# Patient Record
Sex: Male | Born: 1980 | Race: White | Hispanic: No | Marital: Single | Smoking: Never smoker
Health system: Southern US, Community
[De-identification: ages and names within clinical notes are randomized; demographics above are authoritative.]

## PROBLEM LIST (undated history)

## (undated) DIAGNOSIS — R091 Pleurisy: Secondary | ICD-10-CM

## (undated) DIAGNOSIS — F419 Anxiety disorder, unspecified: Secondary | ICD-10-CM

## (undated) DIAGNOSIS — T7840XA Allergy, unspecified, initial encounter: Secondary | ICD-10-CM

## (undated) DIAGNOSIS — E669 Obesity, unspecified: Secondary | ICD-10-CM

## (undated) DIAGNOSIS — H409 Unspecified glaucoma: Secondary | ICD-10-CM

## (undated) DIAGNOSIS — F32A Depression, unspecified: Secondary | ICD-10-CM

## (undated) DIAGNOSIS — F329 Major depressive disorder, single episode, unspecified: Secondary | ICD-10-CM

## (undated) HISTORY — DX: Major depressive disorder, single episode, unspecified: F32.9

## (undated) HISTORY — DX: Allergy, unspecified, initial encounter: T78.40XA

## (undated) HISTORY — DX: Obesity, unspecified: E66.9

## (undated) HISTORY — PX: TONSILLECTOMY AND ADENOIDECTOMY: SUR1326

## (undated) HISTORY — PX: MYRINGOTOMY WITH TUBE PLACEMENT: SHX5663

## (undated) HISTORY — DX: Unspecified glaucoma: H40.9

## (undated) HISTORY — DX: Depression, unspecified: F32.A

## (undated) HISTORY — DX: Anxiety disorder, unspecified: F41.9

## (undated) HISTORY — DX: Pleurisy: R09.1

---

## 2009-03-20 DIAGNOSIS — R091 Pleurisy: Secondary | ICD-10-CM

## 2009-03-20 HISTORY — DX: Pleurisy: R09.1

## 2009-10-26 ENCOUNTER — Emergency Department (HOSPITAL_COMMUNITY): Admission: EM | Admit: 2009-10-26 | Discharge: 2009-10-26 | Payer: Self-pay | Admitting: Emergency Medicine

## 2010-06-03 LAB — POCT I-STAT, CHEM 8
BUN: 14 mg/dL (ref 6–23)
Glucose, Bld: 91 mg/dL (ref 70–99)
Hemoglobin: 16.3 g/dL (ref 13.0–17.0)
Potassium: 4.4 mEq/L (ref 3.5–5.1)

## 2010-11-11 ENCOUNTER — Emergency Department (HOSPITAL_COMMUNITY)
Admission: EM | Admit: 2010-11-11 | Discharge: 2010-11-11 | Disposition: A | Discharge status: Home / Self Care | Payer: BC Managed Care – PPO | Attending: Emergency Medicine | Admitting: Emergency Medicine

## 2010-11-11 DIAGNOSIS — H40219 Acute angle-closure glaucoma, unspecified eye: Secondary | ICD-10-CM | POA: Insufficient documentation

## 2010-11-11 DIAGNOSIS — H5789 Other specified disorders of eye and adnexa: Secondary | ICD-10-CM | POA: Insufficient documentation

## 2010-11-11 DIAGNOSIS — R51 Headache: Secondary | ICD-10-CM | POA: Insufficient documentation

## 2010-11-14 LAB — DIFFERENTIAL
Basophils Relative: 0 % (ref 0–1)
Eosinophils Absolute: 0 10*3/uL (ref 0.0–0.7)
Eosinophils Relative: 0 % (ref 0–5)
Lymphocytes Relative: 10 % — ABNORMAL LOW (ref 12–46)
Lymphs Abs: 1.2 10*3/uL (ref 0.7–4.0)
Neutro Abs: 9.8 10*3/uL — ABNORMAL HIGH (ref 1.7–7.7)

## 2010-11-14 LAB — CBC
HCT: 40.6 % (ref 39.0–52.0)
Hemoglobin: 13.9 g/dL (ref 13.0–17.0)
MCH: 29.8 pg (ref 26.0–34.0)
MCHC: 34.2 g/dL (ref 30.0–36.0)
MCV: 87.1 fL (ref 78.0–100.0)
Platelets: 225 10*3/uL (ref 150–400)
RBC: 4.66 MIL/uL (ref 4.22–5.81)
RDW: 13.2 % (ref 11.5–15.5)
WBC: 11.5 10*3/uL — ABNORMAL HIGH (ref 4.0–10.5)

## 2010-11-14 LAB — APTT: aPTT: 35 seconds (ref 24–37)

## 2011-08-24 IMAGING — CR DG CHEST 2V
2 series · 2 of 2 positions shown · non-contrast
Comparison: None.

CLINICAL DATA: Pleuritic chest pain radiating to left arm.

CHEST - 2 VIEW

[w chest pa]
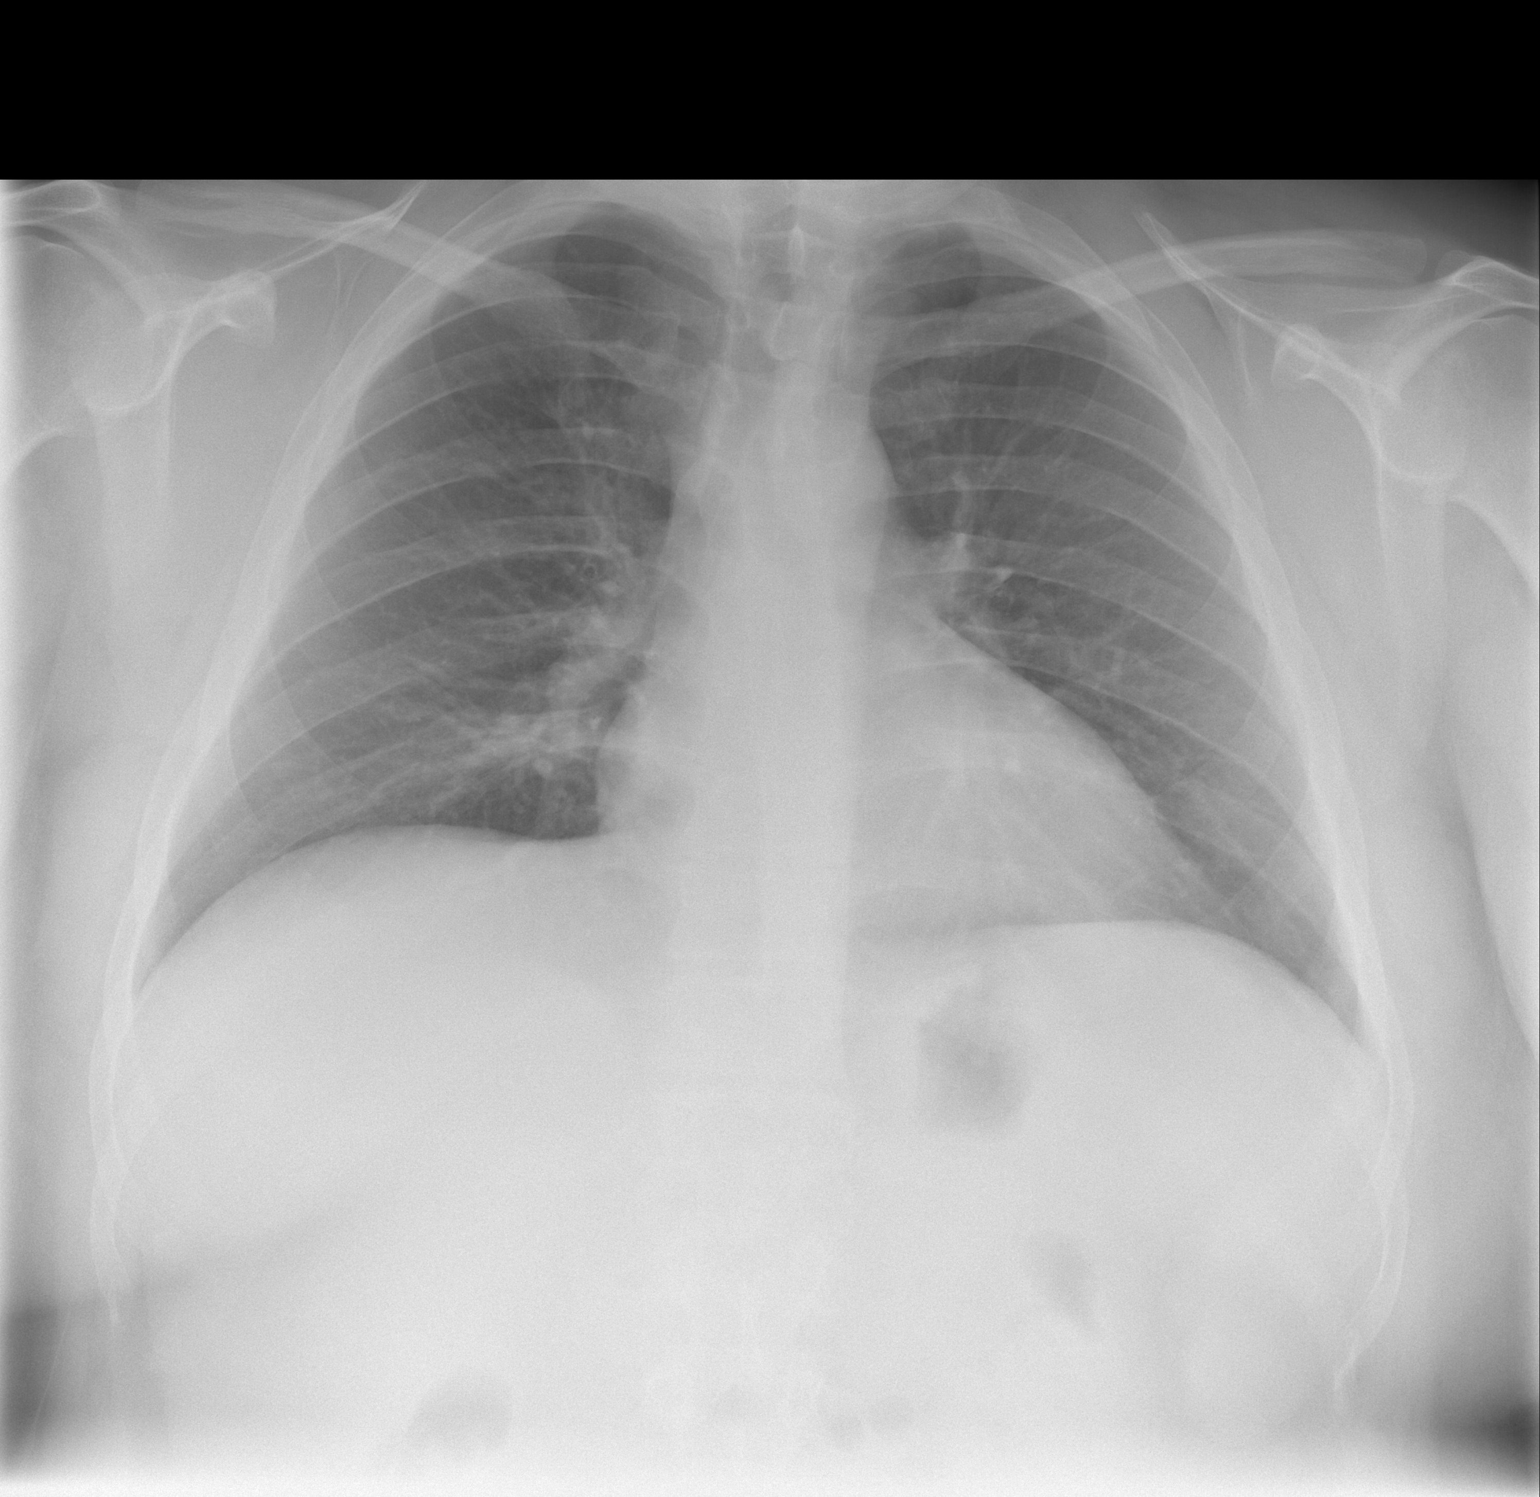

[w chest lat]
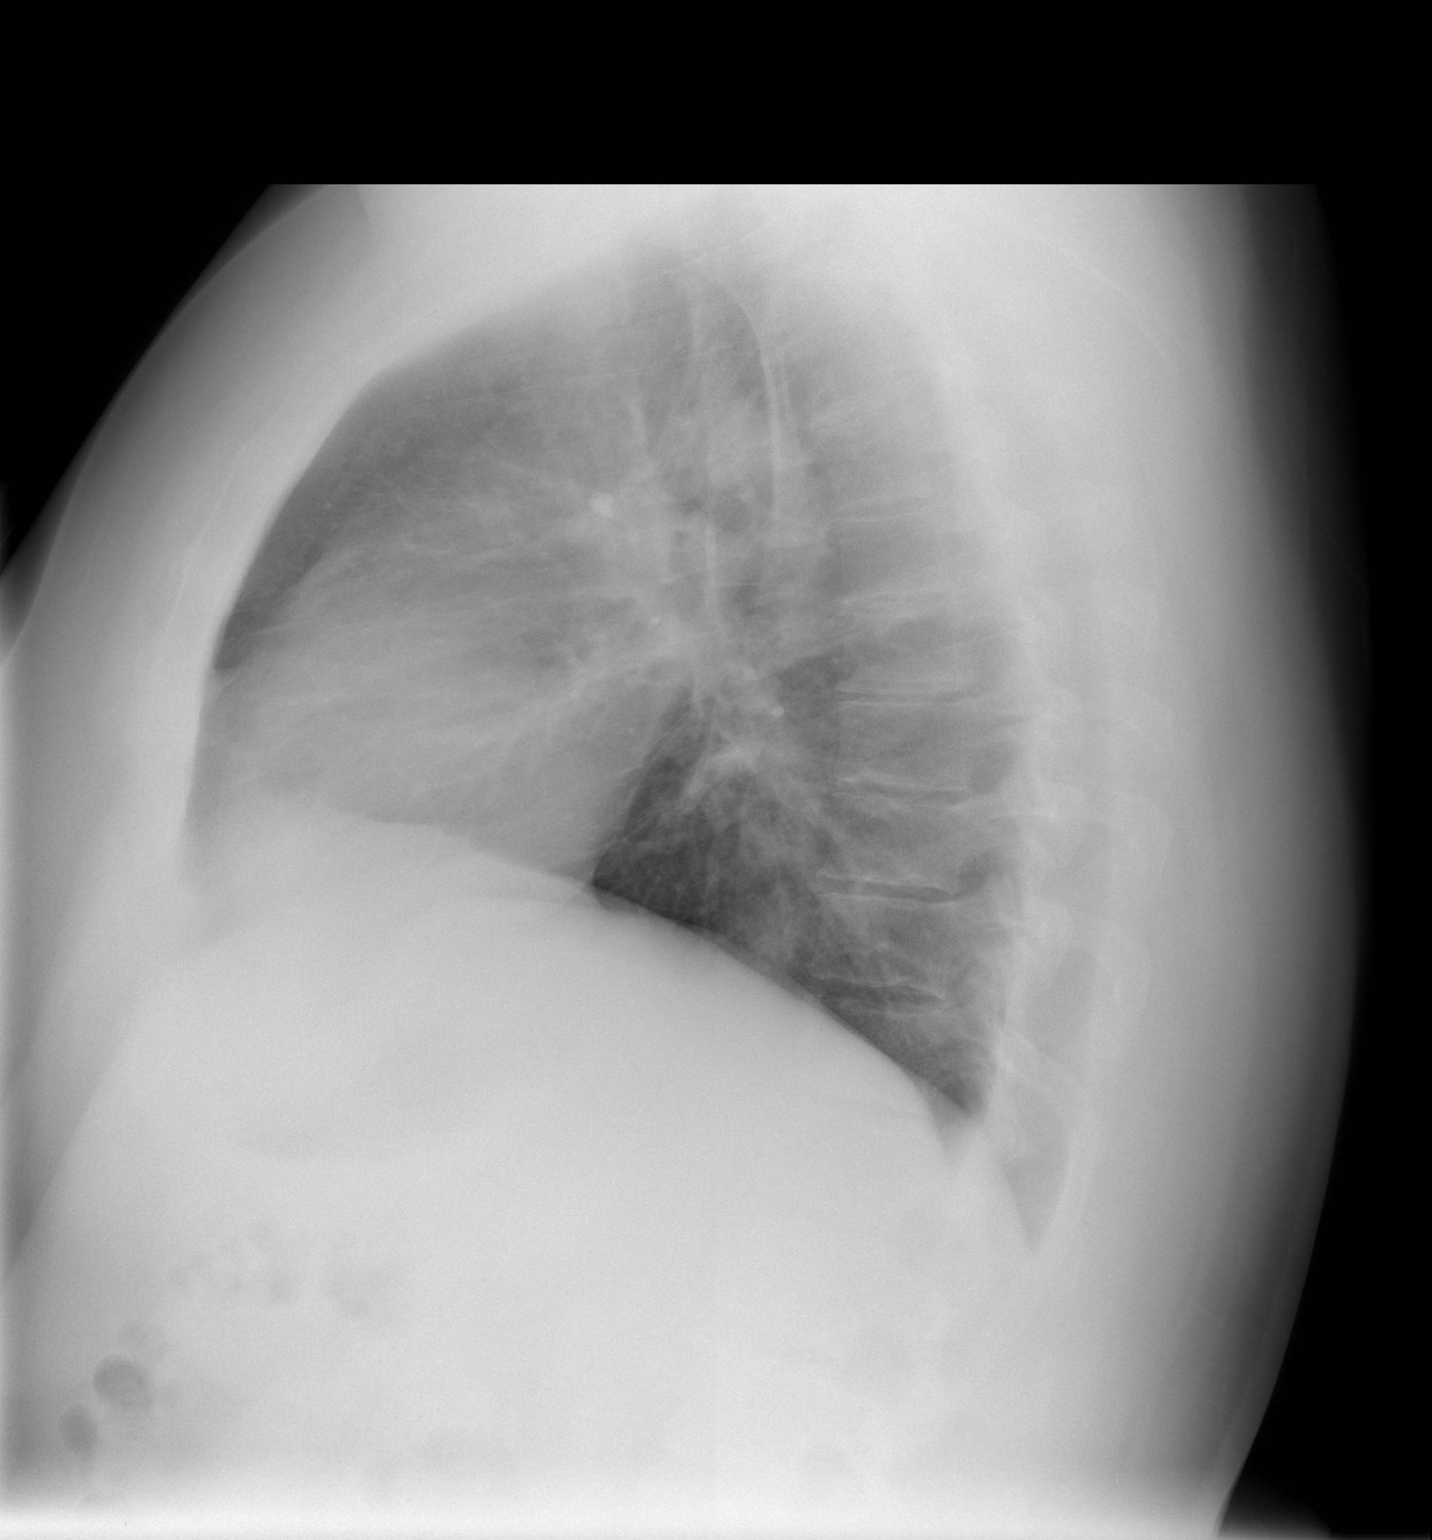

[2 of 2 positions shown; findings below may reference images not displayed]

FINDINGS: Low lung volumes are seen however both lungs are clear.
No evidence of pleural effusion or pneumothorax.  Heart size and
mediastinal contours are normal.
IMPRESSION: No active disease.

## 2012-02-09 ENCOUNTER — Ambulatory Visit (INDEPENDENT_AMBULATORY_CARE_PROVIDER_SITE_OTHER): Payer: BC Managed Care – PPO | Admitting: Physician Assistant

## 2012-02-09 VITALS — BP 111/75 | HR 61 | Temp 98.6°F | Resp 16 | Ht 70.0 in | Wt 208.0 lb

## 2012-02-09 DIAGNOSIS — R197 Diarrhea, unspecified: Secondary | ICD-10-CM

## 2012-02-09 DIAGNOSIS — R112 Nausea with vomiting, unspecified: Secondary | ICD-10-CM

## 2012-02-09 DIAGNOSIS — R350 Frequency of micturition: Secondary | ICD-10-CM

## 2012-02-09 LAB — POCT URINALYSIS DIPSTICK
Bilirubin, UA: NEGATIVE
Blood, UA: NEGATIVE
Glucose, UA: NEGATIVE
Ketones, UA: NEGATIVE
Leukocytes, UA: NEGATIVE
Nitrite, UA: NEGATIVE
Protein, UA: NEGATIVE
Spec Grav, UA: 1.025
Urobilinogen, UA: 0.2
pH, UA: 6.5

## 2012-02-09 LAB — POCT UA - MICROSCOPIC ONLY
Bacteria, U Microscopic: NEGATIVE
Casts, Ur, LPF, POC: NEGATIVE
Crystals, Ur, HPF, POC: NEGATIVE
Epithelial cells, urine per micros: NEGATIVE
RBC, urine, microscopic: NEGATIVE
Yeast, UA: NEGATIVE

## 2012-02-09 MED ORDER — ONDANSETRON 8 MG PO TBDP: 8.0000 mg | ORAL_TABLET | Freq: Three times a day (TID) | ORAL | Status: DC | PRN

## 2012-02-09 NOTE — Patient Instructions (Signed)
Viral Gastroenteritis Viral gastroenteritis is also known as stomach flu. This condition affects the stomach and intestinal tract. It can cause sudden diarrhea and vomiting. The illness typically lasts 3 to 8 days. Most people develop an immune response that eventually gets rid of the virus. While this natural response develops, the virus can make you quite ill. CAUSES  Many different viruses can cause gastroenteritis, such as rotavirus or noroviruses. You can catch one of these viruses by consuming contaminated food or water. You may also catch a virus by sharing utensils or other personal items with an infected person or by touching a contaminated surface. SYMPTOMS  The most common symptoms are diarrhea and vomiting. These problems can cause a severe loss of body fluids (dehydration) and a body salt (electrolyte) imbalance. Other symptoms may include:  Fever.  Headache.  Fatigue.  Abdominal pain. DIAGNOSIS  Your caregiver can usually diagnose viral gastroenteritis based on your symptoms and a physical exam. A stool sample may also be taken to test for the presence of viruses or other infections. TREATMENT  This illness typically goes away on its own. Treatments are aimed at rehydration. The most serious cases of viral gastroenteritis involve vomiting so severely that you are not able to keep fluids down. In these cases, fluids must be given through an intravenous line (IV). HOME CARE INSTRUCTIONS   Drink enough fluids to keep your urine clear or pale yellow. Drink small amounts of fluids frequently and increase the amounts as tolerated.  Ask your caregiver for specific rehydration instructions.  Avoid:  Foods high in sugar.  Alcohol.  Carbonated drinks.  Tobacco.  Juice.  Caffeine drinks.  Extremely hot or cold fluids.  Fatty, greasy foods.  Too much intake of anything at one time.  Dairy products until 24 to 48 hours after diarrhea stops.  You may consume probiotics.  Probiotics are active cultures of beneficial bacteria. They may lessen the amount and number of diarrheal stools in adults. Probiotics can be found in yogurt with active cultures and in supplements.  Wash your hands well to avoid spreading the virus.  Only take over-the-counter or prescription medicines for pain, discomfort, or fever as directed by your caregiver. Do not give aspirin to children. Antidiarrheal medicines are not recommended.  Ask your caregiver if you should continue to take your regular prescribed and over-the-counter medicines.  Keep all follow-up appointments as directed by your caregiver. SEEK IMMEDIATE MEDICAL CARE IF:   You are unable to keep fluids down.  You do not urinate at least once every 6 to 8 hours.  You develop shortness of breath.  You notice blood in your stool or vomit. This may look like coffee grounds.  You have abdominal pain that increases or is concentrated in one small area (localized).  You have persistent vomiting or diarrhea.  You have a fever.  The patient is a child younger than 3 months, and he or she has a fever.  The patient is a child older than 3 months, and he or she has a fever and persistent symptoms.  The patient is a child older than 3 months, and he or she has a fever and symptoms suddenly get worse.  The patient is a baby, and he or she has no tears when crying. MAKE SURE YOU:   Understand these instructions.  Will watch your condition.  Will get help right away if you are not doing well or get worse. Document Released: 03/06/2005 Document Revised: 05/29/2011 Document Reviewed: 12/21/2010   [employer] Patient Information 2013 [employer].  

## 2012-02-09 NOTE — Progress Notes (Signed)
  Subjective:    Patient ID: Karl Bell, male    DOB: [birth year], 31 y.o.   MRN: [id number]  HPI 31 year old male presents 2 day history of nausea with emesis, diarrhea, and epigastric discomfort.  He has had 4-5 episodes of watery diarrhea and 2-3 or episodes of emesis. Emesis seems to be improving (last episode around 2:00 p.m today).  No recent travel or known ill contacts.  Denies fever, chills, hematemesis, hematuria, or hematochezia.  No history of gastric ulcer or h. Pylori infection. No previous abdominal surgeries.  Admits to some urinary frequency but no dysuria or back pain.  He is an otherwise healthy male with no other complaints today. He has been able to keep liquids down, but not solid foods.  He has been eating mostly soup over the past 2 days.     Review of Systems  Constitutional: Negative for fever and chills.  Gastrointestinal: Positive for nausea, vomiting, abdominal pain (epigastric) and diarrhea.  Genitourinary: Positive for frequency. Negative for dysuria.  Neurological: Negative for dizziness and headaches.  All other systems reviewed and are negative.       Objective:   Physical Exam  Constitutional: He is oriented to person, place, and time. He appears well-developed and well-nourished.  HENT:  Head: Normocephalic and atraumatic.  Right Ear: External ear normal.  Left Ear: External ear normal.  Eyes: Conjunctivae normal are normal.  Neck: Normal range of motion.  Cardiovascular: Normal rate, regular rhythm and normal heart sounds.   Pulmonary/Chest: Effort normal and breath sounds normal.  Abdominal: Soft. Bowel sounds are normal. There is no hepatosplenomegaly. There is tenderness (minimal epigastric). There is no rigidity, no rebound, no guarding, no CVA tenderness, no tenderness at McBurney's point and negative Murphy's sign.  Neurological: He is alert and oriented to person, place, and time.  Psychiatric: He has a normal mood and affect. His behavior is  normal. Judgment and thought content normal.      Results for orders placed in visit on 02/09/12  POCT URINALYSIS DIPSTICK      Component Value Range   Color, UA yellow     Clarity, UA clear     Glucose, UA neg     Bilirubin, UA neg     Ketones, UA neg     Spec Grav, UA 1.025     Blood, UA neg     pH, UA 6.5     Protein, UA neg     Urobilinogen, UA 0.2     Nitrite, UA neg     Leukocytes, UA Negative    POCT UA - MICROSCOPIC ONLY      Component Value Range   WBC, Ur, HPF, POC 0-3     RBC, urine, microscopic neg     Bacteria, U Microscopic neg     Mucus, UA trace     Epithelial cells, urine per micros neg     Crystals, Ur, HPF, POC neg     Casts, Ur, LPF, POC neg     Yeast, UA neg         Assessment & Plan:   1. Nausea with vomiting  ondansetron (ZOFRAN-ODT) 8 MG disintegrating tablet  2. Urinary frequency  POCT urinalysis dipstick, POCT UA - Microscopic Only  3. Diarrhea     Reassurance provided - likely viral gastroenteritis Zofran tid prn nausea BRAT diet as tolerated Immodium as needed for diarrhea RTC or go to ER with worsening abdominal pain, fever, or chills.

## 2012-02-13 ENCOUNTER — Ambulatory Visit (INDEPENDENT_AMBULATORY_CARE_PROVIDER_SITE_OTHER): Payer: BC Managed Care – PPO | Admitting: Physician Assistant

## 2012-02-13 VITALS — BP 144/82 | HR 60 | Temp 98.4°F | Resp 16 | Ht 69.5 in | Wt 207.2 lb

## 2012-02-13 DIAGNOSIS — B309 Viral conjunctivitis, unspecified: Secondary | ICD-10-CM

## 2012-02-13 NOTE — Progress Notes (Signed)
   [address] [place] [address]   Phone [phone]  Subjective:    Patient ID: Karl Bell, male    DOB: Aug 30, 1980, 31 y.o.   MRN: [id number]  HPI Pt presents to clinic with mild L eye redness that he had when he woke up this am.  Karl Bell to bed normal last pm and this am both eyes were crusty. His L eye is slightly red but only a little bit of clear d/c from eye today.  No vision change.  Only wears glasses, never contacts.  Eye feels swollen.  Review of Systems  Constitutional: Negative for fever and chills.  HENT: Negative for congestion, rhinorrhea and postnasal drip.   Eyes: Positive for discharge and redness. Negative for photophobia, pain and visual disturbance.  Respiratory: Negative for cough.        Objective:   Physical Exam  Nursing note and vitals reviewed. Constitutional: He is oriented to person, place, and time. He appears well-developed and well-nourished.  HENT:  Head: Normocephalic and atraumatic.  Right Ear: External ear normal.  Left Ear: External ear normal.  Eyes: EOM are normal. Pupils are equal, round, and reactive to light. Right eye exhibits no discharge. Left eye exhibits no discharge. Right conjunctiva is injected (mild medial injection, limbus clear). No scleral icterus.  Neck: Neck supple. No thyromegaly present.  Cardiovascular: Normal rate, regular rhythm and normal heart sounds.   No murmur heard. Neurological: He is alert and oriented to person, place, and time.  Skin: Skin is warm and dry.  Psychiatric: He has a normal mood and affect. His behavior is normal. Judgment and thought content normal.       Assessment & Plan:   1. Viral conjunctivitis    Pt to use artifical tears.  Monitor for change. If eyes get totally red and he starts to have yellow drainage all day call for abx gtts.

## 2012-02-26 ENCOUNTER — Ambulatory Visit (INDEPENDENT_AMBULATORY_CARE_PROVIDER_SITE_OTHER): Payer: 59 | Admitting: Family Medicine

## 2012-02-26 VITALS — BP 137/85 | HR 80 | Temp 98.4°F | Resp 18 | Ht 70.0 in | Wt 211.0 lb

## 2012-02-26 DIAGNOSIS — H109 Unspecified conjunctivitis: Secondary | ICD-10-CM

## 2012-02-26 DIAGNOSIS — H53149 Visual discomfort, unspecified: Secondary | ICD-10-CM

## 2012-02-26 NOTE — Progress Notes (Signed)
  Subjective:    Patient ID: Karl Bell, male    DOB: 1980-11-21, 31 y.o.   MRN: [id number]  HPI 31 year old male presents with acute onset of left eye erythema yesterday.  States it started sometime in the afternoon around 4:30 p.m.  Later in the evening it was accompanied by extreme photosensitivity and frontal/occipital headache. He took ibuprofen which helped slightly.  Admits to watery drainage when looking at lights. Denies any purulent drainage or crusting.  Right eye is not affected.  Denies nausea, vomiting, fever, chills, URI symptoms, or abdominal pain.  States the light sensitivity has improved slightly today and headache has resolved, however his vision has become more blurred.  Was treated for viral URI of the same eye on 11/26 which subsequently improved. He is otherwise healthy with no other complaints today. 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  Also had allergic reaction to sulfa eye drops that caused retinal scarring in his left eye.      Review of Systems  Constitutional: Negative for fever and chills.  HENT: Negative for ear pain, congestion, sore throat, rhinorrhea, neck pain and postnasal drip.   Eyes: Positive for photophobia, pain, redness and visual disturbance (blurred vision).  Gastrointestinal: Negative for nausea and vomiting.  Neurological: Negative for dizziness and headaches.  All other systems reviewed and are negative.       Objective:   Physical Exam  Constitutional: He is oriented to person, place, and time. He appears well-developed and well-nourished.  HENT:  Head: Normocephalic and atraumatic.  Right Ear: Hearing, tympanic membrane, external ear and ear canal normal.  Left Ear: Hearing, tympanic membrane, external ear and ear canal normal.  Mouth/Throat: Uvula is midline, oropharynx is clear and moist and mucous membranes are normal. No oropharyngeal exudate.  Eyes: EOM and lids are normal. Left  conjunctiva is injected. Pupils are unequal (left pupil smaller than right).  Neck: Normal range of motion.  Cardiovascular: Normal rate, regular rhythm and normal heart sounds.   Pulmonary/Chest: Effort normal and breath sounds normal.  Lymphadenopathy:    He has no cervical adenopathy.  Neurological: He is alert and oriented to person, place, and time.  Psychiatric: He has a normal mood and affect. His behavior is normal. Judgment and thought content normal.          Assessment & Plan:   1. Photophobia   2. Conjunctivitis   Etiology unclear - sent to opthalmology for further evaluation and treatment.

## 2012-02-26 NOTE — Progress Notes (Signed)
  Subjective:    Patient ID: Karl Bell, male    DOB: 16-Aug-1980, 31 y.o.   MRN: [id number]  Eye Pain  Associated symptoms include eye redness and photophobia. Pertinent negatives include no fever, nausea or vomiting.   31 year old male presents with acute onset of left eye erythema yesterday.  States it started sometime in the afternoon around 4:30 p.m.  Later in the evening it was accompanied by extreme photosensitivity and frontal/occipital headache. He took ibuprofen which helped slightly.  Admits to watery drainage when looking at lights. Denies any purulent drainage or crusting.  Right eye is not affected.  Denies nausea, vomiting, fever, chills, URI symptoms, or abdominal pain.  States the light sensitivity has improved slightly today and headache has resolved, however his vision has become more blurred.  Was treated for viral URI of the same eye on 11/26 which subsequently improved. He is otherwise healthy with no other complaints today. 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  Also had allergic reaction to sulfa eye drops that caused retinal scarring in his left eye.      Review of Systems  Constitutional: Negative for fever and chills.  HENT: Negative for ear pain, congestion, sore throat, rhinorrhea, neck pain and postnasal drip.   Eyes: Positive for photophobia, pain, redness and visual disturbance (blurred vision).  Gastrointestinal: Negative for nausea and vomiting.  Neurological: Negative for dizziness and headaches.  All other systems reviewed and are negative.       Objective:   Physical Exam  Constitutional: He is oriented to person, place, and time. He appears well-developed and well-nourished.  HENT:  Head: Normocephalic and atraumatic.  Right Ear: Hearing, tympanic membrane, external ear and ear canal normal.  Left Ear: Hearing, tympanic membrane, external ear and ear canal normal.  Mouth/Throat: Uvula is midline,  oropharynx is clear and moist and mucous membranes are normal. No oropharyngeal exudate.  Eyes: EOM and lids are normal. Left conjunctiva is injected. Pupils are unequal (left pupil smaller than right).  Neck: Normal range of motion.  Cardiovascular: Normal rate, regular rhythm and normal heart sounds.   Pulmonary/Chest: Effort normal and breath sounds normal.  Lymphadenopathy:    He has no cervical adenopathy.  Neurological: He is alert and oriented to person, place, and time.  Psychiatric: He has a normal mood and affect. His behavior is normal. Judgment and thought content normal.          Assessment & Plan:   1. Photophobia   2. Conjunctivitis   Etiology unclear - sent to opthalmology for further evaluation and treatment.

## 2012-06-28 ENCOUNTER — Ambulatory Visit (INDEPENDENT_AMBULATORY_CARE_PROVIDER_SITE_OTHER): Payer: 59 | Admitting: Physician Assistant

## 2012-06-28 DIAGNOSIS — F32A Depression, unspecified: Secondary | ICD-10-CM | POA: Insufficient documentation

## 2012-06-28 DIAGNOSIS — H409 Unspecified glaucoma: Secondary | ICD-10-CM | POA: Insufficient documentation

## 2012-06-28 DIAGNOSIS — H6690 Otitis media, unspecified, unspecified ear: Secondary | ICD-10-CM

## 2012-06-28 DIAGNOSIS — F329 Major depressive disorder, single episode, unspecified: Secondary | ICD-10-CM | POA: Insufficient documentation

## 2012-06-28 DIAGNOSIS — E669 Obesity, unspecified: Secondary | ICD-10-CM | POA: Insufficient documentation

## 2012-06-28 MED ORDER — IPRATROPIUM BROMIDE 0.03 % NA SOLN: 2.0000 | Freq: Two times a day (BID) | NASAL | Status: DC

## 2012-06-28 MED ORDER — GUAIFENESIN ER 1200 MG PO TB12: 1.0000 | ORAL_TABLET | Freq: Two times a day (BID) | ORAL | Status: DC | PRN

## 2012-06-28 MED ORDER — CLARITHROMYCIN ER 500 MG PO TB24: 1000.0000 mg | ORAL_TABLET | Freq: Every day | ORAL | Status: DC

## 2012-06-28 NOTE — Progress Notes (Signed)
Subjective:    Patient ID: Karl Bell, male    DOB: 06-02-1980, 32 y.o.   MRN: [id number]  HPI This 32 y.o. male presents for evaluation of LEFT ear pain. He's had nasal and sinus pressure and congestion and drainage for about a week. No fever, chills.  Has had some nausea. No diarrhea, myalgias, arthralgias. Thought he was recovering, but today symptoms worsened again and he's now having significant LEFT ear pain.     Past Medical History  Diagnosis Date  . Depression   . Glaucoma     2 acute episodes  . Pleurisy 2011  . Obesity     Past Surgical History  Procedure Laterality Date  . Myringotomy with tube placement Bilateral     x 5 sets of tubes  . Tonsillectomy and adenoidectomy      Prior to Admission medications   Medication Sig Start Date End Date Taking? Authorizing Provider  DULoxetine (CYMBALTA) 60 MG capsule Take 60 mg by mouth daily.   Yes Historical Provider, MD    Allergies  Allergen Reactions  . Penicillins Hives    severe  . Sulfa Antibiotics Hives    severe    History   Social History  . Marital Status: Single    Spouse Name: n/a    Number of Children: 0  . Years of Education: 15   Occupational History  . IT Tech    Social History Main Topics  . Smoking status: Never Smoker   . Smokeless tobacco: Never Used  . Alcohol Use: 1 - 1.5 oz/week    2-3 drink(s) per week  . Drug Use: No  . Sexually Active: Yes    Birth Control/ Protection: Condom     Comment: 4 partners in the past 12 months (06/2012)   Other Topics Concern  . Not on file   Social History Narrative   Lives alone.    Family History  Problem Relation Age of Onset  . Diabetes Mother   . Celiac disease Maternal Grandmother   . Heart disease Maternal Grandfather   . Mental illness Paternal Grandmother   . Heart disease Paternal Grandfather     s/p 4V CABG     Review of Systems As above.    Objective:   Physical Exam Blood pressure 124/78, pulse 70, temperature 98 F  (36.7 C), temperature source Oral, resp. rate 17, height 5\' 10"  (1.778 m), weight 221 lb (100.245 kg), SpO2 99.00%. Body mass index is 31.71 kg/(m^2). Well-developed, well nourished WM who is awake, alert and oriented, in NAD. HEENT: [place]/AT, PERRL, EOMI.  Sclera and conjunctiva are clear.  EAC are patent, RIGHT TM is dull but not retracted or injected.  LEFT TM is injected, mildly bulging and opaque in the attic. Nasal mucosa is pink and moist. OP is clear. Neck: supple, mildly tender in the tonsillar region L>R, no lymphadenopathy, thyromegaly. Heart: RRR, no murmur Lungs: normal effort, CTA Extremities: no cyanosis, clubbing or edema. Skin: warm and dry without rash. Psychologic: good mood and appropriate affect, normal speech and behavior.      Assessment & Plan:  AOM (acute otitis media), left - Plan: clarithromycin (BIAXIN XL) 500 MG 24 hr tablet, ipratropium (ATROVENT) 0.03 % nasal spray, Guaifenesin (MUCINEX MAXIMUM STRENGTH) 1200 MG TB12  Patient Instructions  Get plenty of rest and drink at least 64 ounces of water daily. Stop using any decongestant products for now. Using ibuprofen can also help reduce the inflammation and swelling.   Akirra Lacerda S.  Leotis Shames, PA-C Physician Assistant-Certified [facility]

## 2012-06-28 NOTE — Patient Instructions (Addendum)
Get plenty of rest and drink at least 64 ounces of water daily. Stop using any decongestant products for now. Using ibuprofen can also help reduce the inflammation and swelling.

## 2012-08-01 ENCOUNTER — Ambulatory Visit (INDEPENDENT_AMBULATORY_CARE_PROVIDER_SITE_OTHER): Payer: 59 | Admitting: Family Medicine

## 2012-08-01 VITALS — BP 140/78 | HR 60 | Temp 98.0°F | Resp 16 | Ht 70.75 in | Wt 221.2 lb

## 2012-08-01 DIAGNOSIS — J329 Chronic sinusitis, unspecified: Secondary | ICD-10-CM

## 2012-08-01 DIAGNOSIS — J309 Allergic rhinitis, unspecified: Secondary | ICD-10-CM

## 2012-08-01 MED ORDER — OLOPATADINE HCL 0.6 % NA SOLN: 2.0000 [drp] | Freq: Two times a day (BID) | NASAL | Status: DC

## 2012-08-01 MED ORDER — AZITHROMYCIN 250 MG PO TABS: ORAL_TABLET | ORAL | Status: DC

## 2012-08-01 NOTE — Progress Notes (Signed)
[facility]:  Office Visit  Chief Complaint:  Chief Complaint  Patient presents with  . Cough    today  . Sinusitis    4 days    HPI: Karl Bell is a 32 y.o. male who complains of  4 day history of runny nose 4-5 days, then woke up with cough this AM. He has a h/o allergies in the last few years. He has taken zyrtec x last 3 dayss. Yellow sputum,. Nonsmoker. No ear pain. + facial pressure. 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 Denies fevers, chills, chest pain, SOB.   Past Medical History  Diagnosis Date  . Depression   . Pleurisy 2011  . Obesity   . Allergy   . Anxiety   . Glaucoma     2 acute episodes   Past Surgical History  Procedure Laterality Date  . Myringotomy with tube placement Bilateral     x 5 sets of tubes  . Tonsillectomy and adenoidectomy     History   Social History  . Marital Status: Single    Spouse Name: n/a    Number of Children: 0  . Years of Education: 15   Occupational History  . IT Tech    Social History Main Topics  . Smoking status: Never Smoker   . Smokeless tobacco: Never Used  . Alcohol Use: 1 - 1.5 oz/week    2-3 drink(s) per week  . Drug Use: No  . Sexually Active: Yes    Birth Control/ Protection: Condom     Comment: 4 partners in the past 12 months (06/2012)   Other Topics Concern  . None   Social History Narrative   Lives alone.   Family History  Problem Relation Age of Onset  . Diabetes Mother   . Celiac disease Maternal Grandmother   . Heart disease Maternal Grandfather   . Mental illness Paternal Grandmother   . Heart disease Paternal Grandfather     s/p 4V CABG   Allergies  Allergen Reactions  . Penicillins Hives    severe  . Sulfa Antibiotics Hives    severe   Prior to Admission medications   Medication Sig Start Date End Date Taking? Authorizing Provider  DULoxetine (CYMBALTA) 60 MG capsule Take 60 mg by mouth daily.    Historical Provider, MD  ipratropium  (ATROVENT) 0.03 % nasal spray Place 2 sprays into the nose 2 (two) times daily. 06/28/12   Chelle S Jeffery, PA-C     ROS: The patient denies fevers, chills, night sweats, unintentional weight loss, chest pain, palpitations, wheezing, dyspnea on exertion, nausea, vomiting, abdominal pain, dysuria, hematuria, melena, numbness, weakness, or tingling.   All other systems have been reviewed and were otherwise negative with the exception of those mentioned in the HPI and as above.    PHYSICAL EXAM: Filed Vitals:   08/01/12 1221  BP: 140/78  Pulse: 60  Temp: 98 F (36.7 C)  Resp: 16   Filed Vitals:   08/01/12 1221  Height: 5' 10.75" (1.797 m)  Weight: 221 lb 3.2 oz (100.336 kg)   Body mass index is 31.07 kg/(m^2).  General: Alert, no acute distress HEENT:  Normocephalic, atraumatic, oropharynx patent. TM scarred. No exudates. No sinus tenderness. No sinus tenderness. Boggy erythematous nares.  Cardiovascular:  Regular rate and rhythm, no rubs murmurs or gallops.  No Carotid bruits, radial pulse intact. No pedal edema.  Respiratory: Clear to auscultation bilaterally.  No wheezes, rales,  or rhonchi.  No cyanosis, no use of accessory musculature GI: No organomegaly, abdomen is soft and non-tender, positive bowel sounds.  No masses. Skin: No rashes. Neurologic: Facial musculature symmetric. Psychiatric: Patient is appropriate throughout our interaction. Lymphatic: No cervical lymphadenopathy Musculoskeletal: Gait intact.   LABS:    EKG/XRAY:   Primary read interpreted by Dr. Conley Rolls at [facility].   ASSESSMENT/PLAN: Encounter Diagnoses  Name Primary?  . Sinusitis nasal Yes  . Allergic rhinitis     Most likely viral or allergy related sxs, sxs treatment for now Rx Patanase Patient has a h/o glaucoma so I do not feel comfortable prescribing steroids or steroid nasal sprays. C/w zyrtec for now, it is ok to have antihistamine x 2 Saline nasal washes Avoid allergens Mutiple drug  allergies. Given z-pack and if have persistent sxs after 7 days then may take rx given.  F/u prn      LE, THAO PHUONG, DO 08/01/2012 1:28 PM

## 2012-08-01 NOTE — Patient Instructions (Addendum)

## 2012-09-02 ENCOUNTER — Ambulatory Visit (INDEPENDENT_AMBULATORY_CARE_PROVIDER_SITE_OTHER): Payer: 59 | Admitting: Internal Medicine

## 2012-09-02 ENCOUNTER — Ambulatory Visit: Payer: 59

## 2012-09-02 VITALS — BP 114/76 | HR 51 | Temp 98.2°F | Resp 20 | Ht 70.0 in | Wt 232.2 lb

## 2012-09-02 DIAGNOSIS — M6740 Ganglion, unspecified site: Secondary | ICD-10-CM

## 2012-09-02 DIAGNOSIS — R229 Localized swelling, mass and lump, unspecified: Secondary | ICD-10-CM

## 2012-09-02 DIAGNOSIS — M67432 Ganglion, left wrist: Secondary | ICD-10-CM

## 2012-09-02 NOTE — Patient Instructions (Signed)
Ganglion Cyst °A ganglion cyst is a noncancerous, fluid-filled lump that occurs near joints or tendons. The ganglion cyst grows out of a joint or the lining of a tendon. It most often develops in the hand or wrist but can also develop in the shoulder, elbow, hip, knee, ankle, or foot. The round or oval ganglion can be pea sized or larger than a grape. Increased activity may enlarge the size of the cyst because more fluid starts to build up.  °CAUSES  °It is not completely known what causes a ganglion cyst to grow. However, it may be related to: °· Inflammation or irritation around the joint. °· An injury. °· Repetitive movements or overuse. °· Arthritis. °SYMPTOMS  °A lump most often appears in the hand or wrist, but can occur in other areas of the body. Generally, the lump is painless without other symptoms. However, sometimes pain can be felt during activity or when pressure is applied to the lump. The lump may even be tender to the touch. Tingling, pain, numbness, or muscle weakness can occur if the ganglion cyst presses on a nerve. Your grip may be weak and you may have less movement in your joints.  °DIAGNOSIS  °Ganglion cysts are most often diagnosed based on a physical exam, noting where the cyst is and how it looks. Your caregiver will feel the lump and may shine a light alongside it. If it is a ganglion, a light often shines through it. Your caregiver may order an X-ray, ultrasound, or MRI to rule out other conditions. °TREATMENT  °Ganglions usually go away on their own without treatment. If pain or other symptoms are involved, treatment may be needed. Treatment is also needed if the ganglion limits your movement or if it gets infected. Treatment options include: °· Wearing a wrist or finger brace or splint. °· Taking anti-inflammatory medicine. °· Draining fluid from the lump with a needle (aspiration). °· Injecting a steroid into the joint. °· Surgery to remove the ganglion cyst and its stalk that is  attached to the joint or tendon. However, ganglion cysts can grow back. °HOME CARE INSTRUCTIONS  °· Do not press on the ganglion, poke it with a needle, or hit it with a heavy object. You may rub the lump gently and often. Sometimes fluid moves out of the cyst. °· Only take medicines as directed by your caregiver. °· Wear your brace or splint as directed by your caregiver. °SEEK MEDICAL CARE IF:  °· Your ganglion becomes larger or more painful. °· You have increased redness, red streaks, or swelling. °· You have pus coming from the lump. °· You have weakness or numbness in the affected area. °MAKE SURE YOU:  °· Understand these instructions. °· Will watch your condition. °· Will get help right away if you are not doing well or get worse. °Document Released: 03/03/2000 Document Revised: 11/29/2011 Document Reviewed: 04/30/2007 °ExitCare® Patient Information ©2014 [employer]. ° °

## 2012-09-02 NOTE — Progress Notes (Signed)
  Subjective:    Patient ID: Karl Bell, male    DOB: 17-Jun-1980, 32 y.o.   MRN: [id number]  HPI Patient presents today with a knot on the top of his left wrist. He noticed it for the first time last night. There is no pain to the touch. He has no loss of range of motion. The ring and pinky finger on the left side feel slight tingle. The knot is hard to the touch. He has never had any injury to the wrist in the past and no injury this time. Has no pain or problems using wrist.    Review of Systems     Objective:   Physical Exam  Vitals reviewed. Constitutional: He is oriented to person, place, and time. He appears well-developed and well-nourished. No distress.  Pulmonary/Chest: Effort normal.  Musculoskeletal: Normal range of motion. He exhibits no edema and no tenderness.       Left wrist: He exhibits swelling and deformity. He exhibits normal range of motion, no tenderness, no bony tenderness, no effusion, no crepitus and no laceration.       Arms: Cystic mass, firm and fixed  Neurological: He is alert and oriented to person, place, and time. He has normal reflexes. No cranial nerve deficit. He exhibits normal muscle tone. Coordination normal.  Psychiatric: He has a normal mood and affect.   Hand left nmvs intact  [facility] reading (PRIMARY) by  Dr Perrin Maltese sts ulnar dorsal wrist       Assessment & Plan:  Cyst wrist/Ganglionic

## 2013-04-07 ENCOUNTER — Ambulatory Visit (INDEPENDENT_AMBULATORY_CARE_PROVIDER_SITE_OTHER): Payer: 59 | Admitting: Emergency Medicine

## 2013-04-07 VITALS — BP 130/72 | HR 64 | Temp 98.1°F | Resp 16 | Ht 70.0 in | Wt 234.2 lb

## 2013-04-07 DIAGNOSIS — B309 Viral conjunctivitis, unspecified: Secondary | ICD-10-CM

## 2013-04-07 MED ORDER — OFLOXACIN 0.3 % OP SOLN: 1.0000 [drp] | OPHTHALMIC | Status: DC

## 2013-04-07 NOTE — Patient Instructions (Signed)

## 2013-04-07 NOTE — Progress Notes (Signed)
   Subjective:    Patient ID: Karl Bell, male    DOB: 11-May-1980, 33 y.o.   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 There is a mild amount of chemosis  is present. There is injection of the conjunctiva. The cornea itself appears normal. I could not visualize the retina on exam. There is a tender left preauricular node noted      Assessment & Plan:  Will treat with Ocuflox he will notify his doctor if he is not better in 48 hours .

## 2013-06-20 ENCOUNTER — Ambulatory Visit (INDEPENDENT_AMBULATORY_CARE_PROVIDER_SITE_OTHER): Payer: 59 | Admitting: Emergency Medicine

## 2013-06-20 VITALS — BP 120/70 | HR 64 | Temp 98.7°F | Resp 18 | Ht 70.0 in | Wt 232.0 lb

## 2013-06-20 DIAGNOSIS — H8110 Benign paroxysmal vertigo, unspecified ear: Secondary | ICD-10-CM

## 2013-06-20 DIAGNOSIS — J309 Allergic rhinitis, unspecified: Secondary | ICD-10-CM

## 2013-06-20 MED ORDER — MECLIZINE HCL 50 MG PO TABS: 50.0000 mg | ORAL_TABLET | Freq: Three times a day (TID) | ORAL | Status: DC | PRN

## 2013-06-20 NOTE — Progress Notes (Signed)
[facility] [address] [place] [address] [phone]  Date:  06/20/2013   Name:  Karl Bell   DOB:  09-Jun-1980   MRN:  [id number]  PCP:  No PCP Per Patient    Chief Complaint: Dizziness, Cough, Nasal Congestion and Emesis   History of Present Illness:  Karl Bell is a 33 y.o. very pleasant male patient who presents with the following:  Ill for past day.  Developed nasal congestion and drainage that is mucoid.  Cough not productive.  No wheezing or shortness of breath.  Developed dizziness and unsteady gait today.  Says is worse with motion of head.  No fever or chills.  vomitied once yesterday.  Decreased appetite.  No improvement with over the counter medications or other home remedies. Denies other complaint or health concern today.   Patient Active Problem List   Diagnosis Date Noted  . Depression 06/28/2012  . Glaucoma 06/28/2012  . Obesity     Past Medical History  Diagnosis Date  . Depression   . Pleurisy 2011  . Obesity   . Allergy   . Anxiety   . Glaucoma     2 acute episodes    Past Surgical History  Procedure Laterality Date  . Myringotomy with tube placement Bilateral     x 5 sets of tubes  . Tonsillectomy and adenoidectomy      History  Substance Use Topics  . Smoking status: Never Smoker   . Smokeless tobacco: Never Used  . Alcohol Use: 1 - 1.5 oz/week    2-3 drink(s) per week    Family History  Problem Relation Age of Onset  . Diabetes Mother   . Celiac disease Maternal Grandmother   . Heart disease Maternal Grandfather   . Mental illness Paternal Grandmother   . Heart disease Paternal Grandfather     s/p 4V CABG    Allergies  Allergen Reactions  . Penicillins Hives    severe  . Sulfa Antibiotics Hives    severe    Medication list has been reviewed and updated.  Current Outpatient Prescriptions on File Prior to Visit  Medication Sig Dispense Refill  . azithromycin (ZITHROMAX) 250 MG tablet Take 2 tabs  po now then 1 tab po daily for the next 4 days.  6 tablet  0  . ofloxacin (OCUFLOX) 0.3 % ophthalmic solution Place 1 drop into the left eye every 4 (four) hours.  5 mL  0  . Olopatadine HCl 0.6 % SOLN Place 2 drops into the nose 2 (two) times daily.  1 Bottle  0   No current facility-administered medications on file prior to visit.    Review of Systems:  As per HPI, otherwise negative.    Physical Examination: Filed Vitals:   06/20/13 0845  BP: 120/70  Pulse: 64  Temp: 98.7 F (37.1 C)  Resp: 18   Filed Vitals:   06/20/13 0845  Height: 5\' 10"  (1.778 m)  Weight: 232 lb (105.235 kg)   Body mass index is 33.29 kg/(m^2). Ideal Body Weight: Weight in (lb) to have BMI = 25: 173.9  GEN: obese, NAD, Non-toxic, A & O x 3 HEENT: Atraumatic, Normocephalic. Neck supple. No masses, No LAD. Ears and Nose: No external deformity. CV: RRR, No M/G/R. No JVD. No thrill. No extra heart sounds. PULM: CTA B, no wheezes, crackles, rhonchi. No retractions. No resp. distress. No accessory muscle use. ABD: S, NT, ND, +BS. No rebound. No HSM. EXTR:  No c/c/e NEURO Normal gait. Romberg and tandem gait normal. CN 2-12 intact.  PRRERLA EOMI  PSYCH: Normally interactive. Conversant. Not depressed or anxious appearing.  Calm demeanor.    Assessment and Plan: Benign positional vertigo Obesity Allergic rhinitis antivert   Signed,  Phillips OdorJeffery Honesti Seaberg, MD

## 2013-06-20 NOTE — Patient Instructions (Signed)

## 2013-06-22 ENCOUNTER — Ambulatory Visit (INDEPENDENT_AMBULATORY_CARE_PROVIDER_SITE_OTHER): Payer: 59 | Admitting: Emergency Medicine

## 2013-06-22 VITALS — BP 130/70 | HR 74 | Temp 98.5°F | Resp 16 | Ht 70.0 in | Wt 229.0 lb

## 2013-06-22 DIAGNOSIS — H8110 Benign paroxysmal vertigo, unspecified ear: Secondary | ICD-10-CM

## 2013-06-22 DIAGNOSIS — H6690 Otitis media, unspecified, unspecified ear: Secondary | ICD-10-CM

## 2013-06-22 DIAGNOSIS — J329 Chronic sinusitis, unspecified: Secondary | ICD-10-CM

## 2013-06-22 MED ORDER — AZITHROMYCIN 250 MG PO TABS: ORAL_TABLET | ORAL | Status: DC

## 2013-06-22 MED ORDER — DIAZEPAM 2 MG PO TABS: ORAL_TABLET | ORAL | Status: DC

## 2013-06-22 NOTE — Patient Instructions (Signed)
Vertigo Vertigo means you feel like you or your surroundings are moving when they are not. Vertigo can be dangerous if it occurs when you are at work, driving, or performing difficult activities.  CAUSES  Vertigo occurs when there is a conflict of signals sent to your brain from the visual and sensory systems in your body. There are many different causes of vertigo, including:  Infections, especially in the inner ear.  A bad reaction to a drug or misuse of alcohol and medicines.  Withdrawal from drugs or alcohol.  Rapidly changing positions, such as lying down or rolling over in bed.  A migraine headache.  Decreased blood flow to the brain.  Increased pressure in the brain from a head injury, infection, tumor, or bleeding. SYMPTOMS  You may feel as though the world is spinning around or you are falling to the ground. Because your balance is upset, vertigo can cause nausea and vomiting. You may have involuntary eye movements (nystagmus). DIAGNOSIS  Vertigo is usually diagnosed by physical exam. If the cause of your vertigo is unknown, your caregiver may perform imaging tests, such as an MRI scan (magnetic resonance imaging). TREATMENT  Most cases of vertigo resolve on their own, without treatment. Depending on the cause, your caregiver may prescribe certain medicines. If your vertigo is related to body position issues, your caregiver may recommend movements or procedures to correct the problem. In rare cases, if your vertigo is caused by certain inner ear problems, you may need surgery. HOME CARE INSTRUCTIONS   Follow your caregiver's instructions.  Avoid driving.  Avoid operating heavy machinery.  Avoid performing any tasks that would be dangerous to you or others during a vertigo episode.  Tell your caregiver if you notice that certain medicines seem to be causing your vertigo. Some of the medicines used to treat vertigo episodes can actually make them worse in some people. SEEK  IMMEDIATE MEDICAL CARE IF:   Your medicines do not relieve your vertigo or are making it worse.  You develop problems with talking, walking, weakness, or using your arms, hands, or legs.  You develop severe headaches.  Your nausea or vomiting continues or gets worse.  You develop visual changes.  A family member notices behavioral changes.  Your condition gets worse. MAKE SURE YOU:  Understand these instructions.  Will watch your condition.  Will get help right away if you are not doing well or get worse. Document Released: 12/14/2004 Document Revised: 05/29/2011 Document Reviewed: 09/22/2010 ExitCare Patient Information 2014 [employer].  

## 2013-06-22 NOTE — Progress Notes (Signed)
Subjective:    Patient ID: Karl Bell, male    DOB: 02/19/1981, 33 y.o.   MRN: [id number]  Chief Complaint  Patient presents with  . Dizziness    x 2 day  . Otalgia    left ear   This chart was scribed for Lesle Chris, MD by Dorothey Baseman, ED Scribe.   HPI Karl Bell is a 33 y.o. male who presents to [facility] complaining of a constant pain around the left ear, but not within the ear itself, with associated constant dizziness and intermittent, diffuse paresthesias and weakness onset about 2 days ago. He reports some associated tinnitus, described as "being more aware of higher tones." He states that his symptoms are exacerbated with range of motion of the neck. Patient was seen here 2 days ago (by Dr. Perrin Maltese) for similar complaints and he was started on meclizine, which he states has not been effective at providing significant relief of his symptoms. He reports some associated emesis 2 days ago, which he states has since resolved after starting the meclizine. He denies hearing loss, numbness. He reports that he does not currently have an ENT specialist. 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 Dione Booze for this. Patient has allergies to penicillins and sulfa antibiotics. Patient also has a history of depression and anxiety.  Patient Active Problem List   Diagnosis Date Noted  . Depression 06/28/2012  . Glaucoma 06/28/2012  . Obesity     Past Medical History  Diagnosis Date  . Depression   . Pleurisy 2011  . Obesity   . Allergy   . Anxiety   . Glaucoma     2 acute episodes   Current Outpatient Prescriptions on File Prior to Visit  Medication Sig Dispense Refill  . ibuprofen (ADVIL,MOTRIN) 200 MG tablet Take 200 mg by mouth every 6 (six) hours as needed.      . meclizine (ANTIVERT) 50 MG tablet Take 1 tablet (50 mg total) by mouth 3 (three) times daily as needed.  30 tablet  0  . azithromycin  (ZITHROMAX) 250 MG tablet Take 2 tabs po now then 1 tab po daily for the next 4 days.  6 tablet  0  . ofloxacin (OCUFLOX) 0.3 % ophthalmic solution Place 1 drop into the left eye every 4 (four) hours.  5 mL  0  . Olopatadine HCl 0.6 % SOLN Place 2 drops into the nose 2 (two) times daily.  1 Bottle  0   No current facility-administered medications on file prior to visit.   Allergies  Allergen Reactions  . Penicillins Hives    severe  . Sulfa Antibiotics Hives    severe    Review of Systems  HENT: Positive for ear pain and tinnitus. Negative for hearing loss.   Gastrointestinal: Positive for vomiting (resolved).  Neurological: Positive for dizziness and weakness. Negative for numbness.      Objective:   Physical Exam  CONSTITUTIONAL: Well developed/well nourished HEAD: Normocephalic/atraumatic EYES: EOMI/PERRL; intermittent nystagmus bilaterally, normal optic discs ENMT: Mucous membranes moist NECK: supple no meningeal signs SPINE:entire spine nontender CV: S1/S2 noted, no murmurs/rubs/gallops noted LUNGS: Lungs are clear to auscultation bilaterally, no apparent distress ABDOMEN: soft, nontender, no rebound or guarding GU:no cva tenderness NEURO: Pt is awake/alert, moves all extremitiesx4; normal DTRs, cranial nerves intact EXTREMITIES: pulses normal, full ROM SKIN: warm, color normal, but clammy PSYCH: no abnormalities of mood  noted  BP 130/70  Pulse 74  Temp(Src) 98.5 F (36.9 C) (Oral)  Resp 16  Ht 5\' 10"  (1.778 m)  Wt 229 lb (103.874 kg)  BMI 32.86 kg/m2  SpO2 98%     Assessment & Plan:  12:46 PM- Will start patient on azithromycin and Valium to manage symptoms. Discussed that the patient may require a brain MRI to delineate the cause of his vertigo. Patient expresses concern about an MRI due to financial reasons. Advised patient to follow up with the referred ENT specialist. Advised patient to avoid driving, especially while taking the medications. Discussed  treatment plan with patient at bedside and patient verbalized agreement.   I personally performed the services described in this documentation, which was scribed in my presence. The recorded information has been reviewed and is accurate.

## 2013-09-11 ENCOUNTER — Emergency Department (HOSPITAL_COMMUNITY)
Admission: EM | Admit: 2013-09-11 | Discharge: 2013-09-11 | Disposition: A | Discharge status: Home / Self Care | Payer: 59 | Attending: Emergency Medicine | Admitting: Emergency Medicine

## 2013-09-11 ENCOUNTER — Encounter (HOSPITAL_COMMUNITY): Payer: Self-pay | Admitting: Emergency Medicine

## 2013-09-11 DIAGNOSIS — Z880 Allergy status to penicillin: Secondary | ICD-10-CM | POA: Insufficient documentation

## 2013-09-11 DIAGNOSIS — Z8669 Personal history of other diseases of the nervous system and sense organs: Secondary | ICD-10-CM | POA: Insufficient documentation

## 2013-09-11 DIAGNOSIS — Z79899 Other long term (current) drug therapy: Secondary | ICD-10-CM | POA: Insufficient documentation

## 2013-09-11 DIAGNOSIS — F410 Panic disorder [episodic paroxysmal anxiety] without agoraphobia: Secondary | ICD-10-CM

## 2013-09-11 DIAGNOSIS — F329 Major depressive disorder, single episode, unspecified: Secondary | ICD-10-CM

## 2013-09-11 DIAGNOSIS — E669 Obesity, unspecified: Secondary | ICD-10-CM | POA: Insufficient documentation

## 2013-09-11 DIAGNOSIS — Z792 Long term (current) use of antibiotics: Secondary | ICD-10-CM | POA: Insufficient documentation

## 2013-09-11 DIAGNOSIS — F411 Generalized anxiety disorder: Secondary | ICD-10-CM

## 2013-09-11 DIAGNOSIS — F3289 Other specified depressive episodes: Secondary | ICD-10-CM | POA: Insufficient documentation

## 2013-09-11 DIAGNOSIS — Z8709 Personal history of other diseases of the respiratory system: Secondary | ICD-10-CM | POA: Insufficient documentation

## 2013-09-11 DIAGNOSIS — F419 Anxiety disorder, unspecified: Secondary | ICD-10-CM

## 2013-09-11 MED ORDER — TRAZODONE HCL 50 MG PO TABS: 50.0000 mg | ORAL_TABLET | Freq: Every day | ORAL | Status: DC

## 2013-09-11 MED ORDER — CLONAZEPAM 1 MG PO TABS: 0.5000 mg | ORAL_TABLET | Freq: Three times a day (TID) | ORAL | Status: DC | PRN

## 2013-09-11 MED ORDER — LORAZEPAM 1 MG PO TABS: 1.0000 mg | ORAL_TABLET | Freq: Two times a day (BID) | ORAL | Status: DC | PRN

## 2013-09-11 MED ORDER — LORAZEPAM 1 MG PO TABS
1.0000 mg | ORAL_TABLET | Freq: Once | ORAL | Status: AC
  Administered 2013-09-11: 1 mg via ORAL
  Filled 2013-09-11: qty 1

## 2013-09-11 NOTE — [facility] Counselor (Addendum)
Writer spoke w/ pt at length. Pt describes crippling anxiety that affects his ability to complete his daily functions. Pt sts he has been only going to work once a week. Pt endorses passive SI. There is a family hx of suicide. Pt endorses daily panic attacks for the past mos. Pt sts he had a 3-week episode of vertigo approx 2 mos ago that didn't have a physiological origin. Pt soft spoken and insightful.  Evette Cristalaroline Paige McLean, [place] Assessment Counselor

## 2013-09-11 NOTE — ED Provider Notes (Signed)
CSN: [id number]     Arrival date & time 09/11/13  1347 History   First MD Initiated Contact with Patient 09/11/13 1512     Chief Complaint  Patient presents with  . Panic Attack     (Consider location/radiation/quality/duration/timing/severity/associated sxs/prior Treatment) HPI   Patient presents to the ER from home with complaints of panic attacks. He reports that he has suffered from depression and vague suicidal ideations his whole life but the panic attacks are new. He reports that he has no clear plan and would never harm himself.His panic attacks are due to him being unhappy with his job over the past 6 months. In the past week he says he has only been to work 2-3 times a week, [facility] program on July 8 but he is worried about his life falling apart before then. He denies substance abuse, is not on any medication for anxiety depression (no PCP), no alcohol use. Denies HI. He feels that maybe he could benefit from talking to someone.   Past Medical History  Diagnosis Date  . Depression   . Pleurisy 2011  . Obesity   . Allergy   . Anxiety   . Glaucoma     2 acute episodes   Past Surgical History  Procedure Laterality Date  . Myringotomy with tube placement Bilateral     x 5 sets of tubes  . Tonsillectomy and adenoidectomy     Family History  Problem Relation Age of Onset  . Diabetes Mother   . Celiac disease Maternal Grandmother   . Heart disease Maternal Grandfather   . Mental illness Paternal Grandmother   . Heart disease Paternal Grandfather     s/p 4V CABG   History  Substance Use Topics  . Smoking status: Never Smoker   . Smokeless tobacco: Never Used  . Alcohol Use: 1.0 - 1.5 oz/week    2-3 drink(s) per week    Review of Systems  Review of Systems  Gen: no weight loss, fevers, chills, night sweats  Eyes: no discharge or drainage, no occular pain or visual changes  Nose: no epistaxis or rhinorrhea   Mouth: no dental pain, no sore throat  Neck: no neck pain  Lungs:No wheezing, coughing or hemoptysis CV: no chest pain, palpitations, dependent edema or orthopnea  Abd: no abdominal pain, nausea, vomiting, diarrhea GU: no dysuria or gross hematuria  MSK:  No muscle weakness or pain Neuro: no headache, no focal neurologic deficits  Skin: no rash or wounds Psyche: + anxiety and depression     Allergies  Penicillins and Sulfa antibiotics  Home Medications   Prior to Admission medications   Medication Sig Start Date End Date Taking? Authorizing Provider  azithromycin (ZITHROMAX) 250 MG tablet Take 2 tabs po now then 1 tab po daily for the next 4 days. 06/22/13   Collene GobbleSteven A Daub, MD  diazepam (VALIUM) 2 MG tablet Take one tablet every 6-8 hours as needed for dizziness 06/22/13   Collene GobbleSteven A Daub, MD  ibuprofen (ADVIL,MOTRIN) 200 MG tablet Take 200 mg by mouth every 6 (six) hours as needed.    Historical Provider, MD  meclizine (ANTIVERT) 50 MG tablet Take 1 tablet (50 mg total) by mouth 3 (three) times daily as needed. 06/20/13   Phillips OdorJeffery Anderson, MD  ofloxacin (OCUFLOX) 0.3 % ophthalmic solution Place 1 drop into the left eye every 4 (four) hours. 04/07/13   Collene GobbleSteven A Daub, MD  Olopatadine HCl 0.6 % SOLN Place 2 drops into the nose 2 (two) times daily. 08/01/12   Thao P Le, DO   BP 152/90  Pulse 52  Temp(Src) 98 F (36.7 C) (Oral)  Resp 16  Wt 252 lb (114.306 kg)  SpO2 99% Physical Exam  Nursing note and vitals reviewed. Constitutional: He appears well-developed and well-nourished. No distress.  HENT:  Head: Normocephalic and atraumatic.  Eyes: Pupils are equal, round, and reactive to light.  Neck: Normal range of motion. Neck supple.  Cardiovascular: Normal rate and regular rhythm.   Pulmonary/Chest: Effort normal.  Abdominal: Soft.  Neurological: He is alert.  Skin: Skin is warm and dry.  Psychiatric: His behavior is normal. His mood appears anxious. His speech is not rapid and/or  pressured. Thought content is not paranoid and not delusional. He exhibits a depressed mood. He expresses no homicidal and no suicidal ideation.    ED Course  Procedures (including critical care time) Labs Review Labs Reviewed - No data to display  Imaging Review No results found.   EKG Interpretation None      MDM   Final diagnoses:  Panic attack   4:16pm Pt doesn't need inpatient therapy and is not SI/HI nor have any medical concerns at this time. I have asked Page with [facility] to speak with patient about suggestions on things he can do from today to July 8 when he gets to start his program. She feels that due to his crippling anxiety that he would benefit from speaking to the NP Psychiatrist. If he chooses to leave then that is fine, he does not appear to be a harm to himself. At this time the patient is cooperative and okay with waiting.   9:57 pm Drenda FreezeFran the Psych NP recommends 50 mg Trazodone at bedtime and 1 mg Klonopin TID prn anxiety. Refer to her note. [facility]  33 y.o.Marko Doggett's evaluation in the Emergency Department is complete. It has been determined that no acute conditions requiring further emergency intervention are present at this time. The patient/guardian have been advised of the diagnosis and plan. We have discussed signs and symptoms that warrant return to the ED, such as changes or worsening in symptoms.  Vital signs are stable at discharge. Filed Vitals:   09/11/13 1837  BP: 156/90  Pulse: 60  Temp:   Resp: 18    Patient/guardian has voiced understanding and agreed to follow-up with the PCP or specialist.   Dorthula Matasiffany G Greene, PA-C 09/11/13 2158

## 2013-09-11 NOTE — Discharge Instructions (Signed)

## 2013-09-11 NOTE — ED Notes (Signed)
TTS consult arrived.

## 2013-09-11 NOTE — Consult Note (Signed)
[facility] Face-to-Face Psychiatry Consult   Reason for Consult:  Referral for psychiatric evaluation Referring Physician:  EDP Bednar/Greene, PA-C Karl Bell is an 33 y.o. male. Total Time spent with patient: 35 minutes  Assessment: AXIS I:  Anxiety Disorder NOS and Depressive Disorder NOS AXIS II:  Deferred AXIS III:   Past Medical History  Diagnosis Date  . Depression   . Pleurisy 2011  . Obesity   . Allergy   . Anxiety   . Glaucoma     2 acute episodes   AXIS IV:  occupational problems and other psychosocial or environmental problems AXIS V:  51-60 moderate symptoms  Plan:  Patient does not meet criteria for psychiatric inpatient admission. Supportive therapy provided about ongoing stressors. Refer to IOP.  Subjective:   Karl Bell is a 33 y.o. male patient who presented to [facility] ED for evaluation of panic attacks. Patient states that he has been having anxiety which has been causing frequent panic attacks. Patient reports that he has panic attacks daily, usually in the mornings when he is getting ready for work. Patient states the panic attacks keep him from "making it to work. I feel low level panic. I feel like I am falling over the edge. The anxiety causes me to have an inability to take action." Patient states that he has not been to work since Monday due to the anxiety.  Patient describes associated symptoms as anxiousness, nausea, heart racing and dry mouth. 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  Patient states "I broke down in my manager's office 6 months ago asking for help with my job."  Patient states work is the primary source of his stress.  Patient states he has been experiencing symptoms for the past 2 months and that things became worse this week.  Patient rates his anxiety 7/10.  States his sleep is poor, averaging 4 hours per  night and "not feeling rested when I wake up." Patient reports that he uses marijuana "to cope with the anxiety." He states he has gained 25 lbs in the past 6 months and that he is very aware of the weight gain; states he weighed 185 lbs 1.5 years ago. States has been making poor food choices and eating out and eating fast food instead of cooking at home like he used to do. Patient denies suicidal ideation, intent or plan at this time. The patient states "I have been dealing with depression since I was 33 years old." Patient states "I have suicidal ideation daily but I don't have a plan and I am able to put those thoughts away. I wouldn't act on the thoughts because I don't want to hurt my family especially my father; his brother hung himself on Christmas when I was 2." Patient denies HI and AVH.  Patient states he saw his counselor, Vernell Leep, yesterday who referred him to [facility] for evaluation.  Patient states he is scheduled to start outpatient services through [facility] on July 8th.     HPI:  33 yo male here for evaluation of panic attacks.  HPI Elements:   Location:  Mood. Quality:  Anxious, Depressed. Severity:  Moderate. Timing:  Onset 1 year ago. Duration:  2 months ago; worsening this week. Context:  Occupational Stress.  Past Psychiatric History: Past Medical History  Diagnosis Date  . Depression   . Pleurisy 2011  .  Obesity   . Allergy   . Anxiety   . Glaucoma     2 acute episodes    reports that he has never smoked. He has never used smokeless tobacco. He reports that he drinks about 1 - 1.5 ounces of alcohol per week. He reports that he does not use illicit drugs. Family History  Problem Relation Age of Onset  . Diabetes Mother   . Celiac disease Maternal Grandmother   . Heart disease Maternal Grandfather   . Mental illness Paternal Grandmother   . Heart disease Paternal Grandfather     s/p 4V CABG        Allergies:   Allergies  Allergen  Reactions  . Sulfa Antibiotics Anaphylaxis    severe  . Penicillins Hives    severe    ACT Assessment Complete:  No:   Past Psychiatric History: No Diagnosis:  Anxiety Disorder, Depressive Disorder  Hospitalizations:  None  Outpatient Care[facility]  [facility] Frazier, [facility]  Substance Abuse Care:  None  Self-Mutilation:  Denies  Suicidal Attempts:  Denies  Homicidal Behaviors:  Denies   Violent Behaviors:  Denies   Place of Residence:  Lives in [place], [place] Marital Status:  Single Employed/Unemployed:  Employed as Stage managerT Tech Education:  Engineer, maintenance (IT)College graduate Family Supports:  Family  Objective: Blood pressure 156/90, pulse 60, temperature 98 F (36.7 C), temperature source Oral, resp. rate 18, weight 114.306 kg (252 lb), SpO2 100.00%.Body mass index is 36.16 kg/(m^2).No results found for this or any previous visit (from the past 72 hour(s)).  Labs not drawn.  No current facility-administered medications for this encounter.   Current Outpatient Prescriptions  Medication Sig Dispense Refill  . cholecalciferol (VITAMIN D) 1000 UNITS tablet Take 1,000 Units by mouth daily.      . diazepam (VALIUM) 2 MG tablet Take one tablet every 6-8 hours as needed for dizziness  30 tablet  0  . Multiple Vitamin (MULTIVITAMIN WITH MINERALS) TABS tablet Take 1 tablet by mouth daily.      . vitamin C (ASCORBIC ACID) 500 MG tablet Take 1,000 mg by mouth daily.      . clonazePAM (KLONOPIN) 1 MG tablet Take 0.5 tablets (0.5 mg total) by mouth 3 (three) times daily as needed for anxiety.  45 tablet  0  . traZODone (DESYREL) 50 MG tablet Take 1 tablet (50 mg total) by mouth at bedtime.  30 tablet  0    Psychiatric Specialty Exam:     Blood pressure 156/90, pulse 60, temperature 98 F (36.7 C), temperature source Oral, resp. rate 18, weight 114.306 kg (252 lb), SpO2 100.00%.Body mass index is 36.16 kg/(m^2).  General Appearance: Casual  Eye Contact::  Good  Speech:  Clear and Coherent and Slow  Volume:  Normal   Mood:  Anxious  Affect:  Congruent  Thought Process:  Coherent  Orientation:  Full (Time, Place, and Person)  Thought Content:  WDL  Suicidal Thoughts:  No  Homicidal Thoughts:  No  Memory:  Immediate;   Good Recent;   Good Remote;   Good  Judgement:  Fair  Insight:  Present  Psychomotor Activity:  Normal  Concentration:  Fair  Recall:  Good  Fund of Knowledge:Good  Language: Good  Akathisia:  No  Handed:  Right  AIMS (if indicated):     Assets:  Communication Skills Desire for Improvement Financial Resources/Insurance Housing Resilience Social Support  Sleep:      Musculoskeletal: Strength & Muscle Tone: within normal limits Gait &  Station: normal Patient leans: N/A  Treatment Plan Summary: Patient can be discharged home once medically cleared. Patient will start Behavioral Health IOP on September 24, 2013.  Medication management: -Start Klonopin 1 mg PO TID PRN anxiety -Start Trazodone 50 mg PO QHS   Plan of care discussed with Dinah Beers. Greene, PA-C  Alberteen SamFran Hobson, FNP-BC 09/11/2013 9:57 PM  I agreed with the findings, treatment and disposition plan of this patient. Kathryne SharperSyed Arfeen, MD

## 2013-09-11 NOTE — ED Notes (Signed)
Pt alert, arrives from home, presents with family, sent per Dr Bascom LevelsFrazier, presents for problems with panic attacks, unable to go to work, denies SI/HI,  resp even unlabored, skin pwd

## 2013-09-14 NOTE — ED Provider Notes (Signed)
Medical screening examination/treatment/procedure(s) were performed by non-physician practitioner and as supervising physician I was immediately available for consultation/collaboration.   EKG Interpretation None       John M Bednar, MD 09/14/13 2102 

## 2013-09-24 ENCOUNTER — Other Ambulatory Visit (HOSPITAL_COMMUNITY): Payer: 59 | Attending: Psychiatry | Admitting: Psychiatry

## 2013-09-24 ENCOUNTER — Encounter (HOSPITAL_COMMUNITY): Payer: Self-pay

## 2013-09-24 DIAGNOSIS — F411 Generalized anxiety disorder: Secondary | ICD-10-CM | POA: Diagnosis not present

## 2013-09-24 DIAGNOSIS — F329 Major depressive disorder, single episode, unspecified: Secondary | ICD-10-CM

## 2013-09-24 DIAGNOSIS — F410 Panic disorder [episodic paroxysmal anxiety] without agoraphobia: Secondary | ICD-10-CM | POA: Diagnosis not present

## 2013-09-24 DIAGNOSIS — F3289 Other specified depressive episodes: Secondary | ICD-10-CM

## 2013-09-24 NOTE — Progress Notes (Signed)
Karl Bell is a 33 y.o. single, Caucasian, male who was referred per psychologist ([name]), treatment for panic attacks and depressive symptoms.  Patient states that he has been having anxiety which has been causing frequent panic attacks. Patient reports that he has panic attacks daily, usually in the mornings when he would get ready for work. Patient states the panic attacks keep him from making it to work. "I feel like I am falling over the edge. The anxiety causes me to have an inability to take action." Patient stated that he had not been to work since 09-08-13 due to the anxiety. Patient describes associated symptoms as anxiousness, nausea, heart racing and dry mouth. Patient reports his anxiety and depression began last year when his supervisor left the company. 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 States he was fired on 09-12-13.  Patient states he has been experiencing symptoms for the past 2 months and that things became worse this week. Patient rates his anxiety 7/10. States his sleep is poor, averaging 2-4 hours per night and "not feeling rested when I wake up." Patient reports that he uses marijuana "to cope with the anxiety." He states he has gained 25 lbs in the past 6 months and that he is very aware of the weight gain; states he weighed 185 lbs 1.5 years ago. States has been making poor food choices and eating out and eating fast food instead of cooking at home like he used to do. The patient states "I have been dealing with depression since I was 33 years old."  Other symptoms include:  Poor self esteem, no motivation, decreased concentration, anhedonia, low energy, crying spells, helplessness, hopelessness, and worthlessness feelings. Patient states "I have suicidal  ideation daily but I don't have a plan and I am able to put those thoughts away. I wouldn't act on the thoughts because I don't want to hurt my family especially my father; his brother hung himself on Christmas when I was 2." Discussed safety options with pt at length.  Pt is able to contract for safety.  Patient denies HI and A/V hallucinations.  No previous psychiatric hospitalizations.  Denies any suicide attempts.  Admits to hx of self injurious behavior (taking his fists and hitting trees). Pt has been seeing Dr. Bascom LevelsFrazier for 1 1/2 yrs. Family Hx:  Paternal Uncle (committed suicide by way of hanging), maternal GF (ETOH), paternal GM (dementia) Childhood:  Born in [place], [place]; raised in [place], [place].  Moved to [place], [place] at age 33.  States parents were "good" parents.  Describes himself as being "listless."  "I read a lot, usually three to four novels within one week."  States he attended a [employer] until he went away to a boarding school in the 10 th grade.  States elementary, middle, and high school was a "breeze" for him.  "Not to be bragging or anything, but I was among the top 98% of my class.  Everything came easy for me.  I wasn't challenged at all, until college."  According to pt, college was very difficult for him.  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  "I didn't have the social skills at all."  Trauma:  Age 32 pt's paternal uncle hung himself.  Denies any abuse. Sibling:  Younger brother who resides in [place].  Is an ICU nurse. Pt resides with a male roommate.  Involved with a divorced male who is currently out of the country adopting a baby.  "I wouldn't say that we are dating, more so just having sex."   Drugs/ETOH:  Denies ETOH.  Admits to smoking THC daily.  States he usually smokes a bowl in the morning and a bowl in the evening.  Been smoking since age 33.  Most recent use was lastnight.  Denies current cigarette use.  Denies any DWI's.  Admitted to trying  mushrooms and salva yrs ago. Reports that his parents and roommate are his support system.  Pt completed all forms.  Scored 31 on the burns.  Pt will attend MH-IOP for ten days.  A:  Oriented pt.  Provided pt with an orientation folder.  Informed Dr. Bascom LevelsFrazier of admit.  Encouraged support groups.  Will refer pt to Vocational Rehab.  Pt has upcoming appt with Dr. Evelene CroonKaur as a new pt on 08-30-13.  R:  Pt receptive.

## 2013-09-24 NOTE — Progress Notes (Signed)
Daily Group Progress Note  Program: IOP  Group Time: 9:00-10:30  Participation Level: Active  Behavioral Response: Appropriate  Type of Therapy:  Group Therapy  Summary of Progress: Pt. Met with case Freight forwarder.     Group Time: 10:30-12:00  Participation Level:  Active  Behavioral Response: Appropriate  Type of Therapy: Psycho-education Group  Summary of Progress: Pt. Participated in discussion about benefits of yoga and participated in yoga grounding exercises.  Nancie Neas, COUNS

## 2013-09-25 ENCOUNTER — Encounter (HOSPITAL_COMMUNITY): Payer: Self-pay | Admitting: Psychiatry

## 2013-09-25 ENCOUNTER — Other Ambulatory Visit (HOSPITAL_COMMUNITY): Payer: 59 | Admitting: Psychiatry

## 2013-09-25 DIAGNOSIS — F329 Major depressive disorder, single episode, unspecified: Secondary | ICD-10-CM

## 2013-09-25 DIAGNOSIS — F410 Panic disorder [episodic paroxysmal anxiety] without agoraphobia: Secondary | ICD-10-CM

## 2013-09-25 DIAGNOSIS — F3289 Other specified depressive episodes: Secondary | ICD-10-CM

## 2013-09-25 NOTE — Progress Notes (Signed)
    Daily Group Progress Note  Program: IOP  Group Time: 9:00-10:30  Participation Level: Active  Behavioral Response: Appropriate  Type of Therapy:  Group Therapy  Summary of Progress:  Pt. Continues to report that he is not sleeping well but sleeps approximately 5 hours a night. Pt. Reports that his home life is not a trigger for his anxiety. Pt. Reports that termination from his employment was a stress trigger and several months prior as his stress level increased and depression worsened. Pt. 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      Group Time: 10:30-12:00  Participation Level:  Active  Behavioral Response: Appropriate  Type of Therapy: Psycho-education Group  Summary of Progress: Pt. Participated in reflective reading about developing patience. Pt. Participated in discussion about use of technology i.e., phone apps to promote mental health.  [name], Jennifer B, COUNS

## 2013-09-26 ENCOUNTER — Encounter (HOSPITAL_COMMUNITY): Payer: Self-pay | Admitting: Psychiatry

## 2013-09-26 ENCOUNTER — Encounter (HOSPITAL_COMMUNITY): Payer: Self-pay

## 2013-09-26 ENCOUNTER — Other Ambulatory Visit (HOSPITAL_COMMUNITY): Payer: 59 | Attending: Psychiatry | Admitting: Psychiatry

## 2013-09-26 DIAGNOSIS — F411 Generalized anxiety disorder: Secondary | ICD-10-CM | POA: Insufficient documentation

## 2013-09-26 DIAGNOSIS — F410 Panic disorder [episodic paroxysmal anxiety] without agoraphobia: Secondary | ICD-10-CM

## 2013-09-26 DIAGNOSIS — Z5987 Material hardship due to limited financial resources, not elsewhere classified: Secondary | ICD-10-CM | POA: Insufficient documentation

## 2013-09-26 DIAGNOSIS — G4700 Insomnia, unspecified: Secondary | ICD-10-CM | POA: Diagnosis not present

## 2013-09-26 DIAGNOSIS — G4710 Hypersomnia, unspecified: Secondary | ICD-10-CM | POA: Insufficient documentation

## 2013-09-26 DIAGNOSIS — F329 Major depressive disorder, single episode, unspecified: Secondary | ICD-10-CM

## 2013-09-26 DIAGNOSIS — F3289 Other specified depressive episodes: Secondary | ICD-10-CM

## 2013-09-26 DIAGNOSIS — Z5989 Other problems related to housing and economic circumstances: Secondary | ICD-10-CM | POA: Insufficient documentation

## 2013-09-26 DIAGNOSIS — Z598 Other problems related to housing and economic circumstances: Secondary | ICD-10-CM | POA: Insufficient documentation

## 2013-09-26 DIAGNOSIS — H409 Unspecified glaucoma: Secondary | ICD-10-CM | POA: Diagnosis not present

## 2013-09-26 DIAGNOSIS — F332 Major depressive disorder, recurrent severe without psychotic features: Secondary | ICD-10-CM | POA: Insufficient documentation

## 2013-09-26 DIAGNOSIS — Z609 Problem related to social environment, unspecified: Secondary | ICD-10-CM | POA: Diagnosis not present

## 2013-09-26 MED ORDER — CITALOPRAM HYDROBROMIDE 10 MG PO TABS: 10.0000 mg | ORAL_TABLET | Freq: Every day | ORAL | Status: DC

## 2013-09-26 NOTE — Progress Notes (Signed)
Patient ID: Karl Bell, male   DOB: 23-Jan-1981, 33 y.o.   MRN: [id number] D:  Patient's insurance terminated on 09-12-13 according to his insurance company.  A:  Discussed fee of program with patient.  Provided pt with Access One and Financial Assistance forms to complete.  R:  Pt receptive.

## 2013-09-26 NOTE — Progress Notes (Signed)
    Daily Group Progress Note  Program: IOP  Group Time: 9:00-10:30  Participation Level: Active  Behavioral Response: Appropriate  Type of Therapy:  Group Therapy  Summary of Progress: Pt. Reporting that he continues to be challenged by need to develop motivation to look for a job. Pt. Discussed feeling of loss and personal failure related to termination of job. Pt. Processed feelings of shame related to mental health diagnosis.     Group Time: 10:30-12:00  Participation Level:  Active  Behavioral Response: Appropriate  Type of Therapy: Psycho-education Group  Summary of Progress: Pt. Participated in heartmath exercise. Pt. Participated in discussion about identifying flow experiences and scheduling opportunities to engage in this type of experience regularly.  [name], [name], COUNS

## 2013-09-26 NOTE — Progress Notes (Signed)
Psychiatric Assessment Adult  Patient Identification:  Karl Bell Date of Evaluation:  09/26/2013 Chief Complaint:  evaluaton History of Chief Complaint:  No chief complaint on file.   HPI Pt is a 33 year old Caucasian male, referred to IOP by Dr. Leretha Dykes for depression. Depression has been around, since age 37.  Pt is on clonazepam at bedtime and trazodone 50 mg hs, given to pt in ED for crisis mode/sleep. Pt says he's not taking either medication consistently.  Pt was having panic attacks, low energy, poor concentration, no motivation, anhedonia  x 4 months. Current stressors are being fired from job. Pt was having panic attacks because of his job, also having vertigo, but it dissipated, after 2 months. Pt was on bupropion for depression, but went manic. Sleeping 4-6 hours, appetite fluctuates. Mood is depressed, anxious, with panic attacks. Depression 6/10, Anxiety, 2/10. He denies Si/hi/avh. Pt is depressed; flat affect. Pt is socially awkward, and anxious. He denies  SI/HI/AVH.  Review of Systems Physical Exam  Depressive Symptoms: depressed mood, anhedonia, insomnia, hypersomnia, psychomotor retardation, fatigue, feelings of worthlessness/guilt, difficulty concentrating, hopelessness, impaired memory, recurrent thoughts of death, suicidal thoughts without plan, anxiety, panic attacks, insomnia, hypersomnia, loss of energy/fatigue, disturbed sleep, weight gain, increased appetite,  (Hypo) Manic Symptoms:   Elevated Mood:  No Irritable Mood:  No Grandiosity:  No Distractibility:  No Labiality of Mood:  No Delusions:  No Hallucinations:  No Impulsivity:  No Sexually Inappropriate Behavior:  No Financial Extravagance:  No Flight of Ideas:  No  Anxiety Symptoms: Excessive Worry:  Yes Panic Symptoms:  Yes Agoraphobia:  Yes Obsessive Compulsive: No  Symptoms: None, Specific Phobias:  No Social Anxiety:  Yes  Psychotic Symptoms:  Hallucinations: No  None Delusions:  No Paranoia:  No   Ideas of Reference:  No  PTSD Symptoms: Ever had a traumatic exposure:  No Had a traumatic exposure in the last month:  No Re-experiencing: No None Hypervigilance:  No Hyperarousal: No None Avoidance: No None  Traumatic Brain Injury: No  Past Psychiatric History: Diagnosis: MDD, recurrent, severe, GAD, Panic Disorder   Hospitalizations: no  Outpatient Care: therapy x 1 year ago   Substance Abuse Care:   Self-Mutilation: no  Suicidal Attempts: no   Violent Behaviors: no    Past Medical History:   Past Medical History  Diagnosis Date  . Depression   . Pleurisy 2011  . Obesity   . Allergy   . Anxiety   . Glaucoma     2 acute episodes   History of Loss of Consciousness:  No Seizure History:  No Cardiac History:  No Allergies:   Allergies  Allergen Reactions  . Sulfa Antibiotics Anaphylaxis    severe  . Penicillins Hives    severe   Current Medications:  Current Outpatient Prescriptions  Medication Sig Dispense Refill  . clonazePAM (KLONOPIN) 1 MG tablet Take 0.5 tablets (0.5 mg total) by mouth 3 (three) times daily as needed for anxiety.  45 tablet  0  . traZODone (DESYREL) 50 MG tablet Take 1 tablet (50 mg total) by mouth at bedtime.  30 tablet  0   No current facility-administered medications for this visit.    Previous Psychotropic Medications:  Medication Dose   bupropion       cymbalta                    Substance Abuse History in the last 12 months: Substance Age of 1st Use Last Use Amount  Specific Type  Nicotine      Alcohol      Cannabis  24  x 3 days ago  1 bowl  daily user x 10 years  Opiates      Cocaine      Methamphetamines      LSD      Ecstasy      Benzodiazepines   33  x 2 days  clonazepam 1 mg tid prn, only used six doses    Caffeine      Inhalants      Others:                        Social History: Current Place of Residence: [place], lives with roommate  Place of Birth: [place],  [place] Family Members: both parents are in [place]  Marital Status:  Single Children: na   Sons: na   Daughters: na Relationships: yes, x 1 year and half  Education:  College x 3 years  Educational Problems/Performance: yes  Religious Beliefs/Practices: none. Pt is an Emergency planning/management officeratheist  History of Abuse: none Occupational Experiences: recently became unemployed, Company secretaryT tech Military History:  None. Legal History: none  Hobbies/Interests: photography, ping pong, and computer gaming   Family History:   Family History  Problem Relation Age of Onset  . Diabetes Mother   . Celiac disease Maternal Grandmother   . Heart disease Maternal Grandfather   . Alcohol abuse Maternal Grandfather   . Mental illness Paternal Grandmother   . Dementia Paternal Grandmother   . Heart disease Paternal Grandfather     s/p 4V CABG  . Depression Paternal Uncle     Mental Status Examination/Evaluation: Objective:  Appearance: Bizarre, Casual, Disheveled and Guarded  Eye Contact::  Fair  Speech:  Garbled and Slow  Volume:  Decreased  Mood:  Anxious, Depressed  Affect:  Depressed, Flat and Inappropriate  Thought Process:  Circumstantial  Orientation:  Full (Time, Place, and Person)  Thought Content:  Obsessions and Rumination  Suicidal Thoughts:  No  Homicidal Thoughts:  No  Judgement:  Impaired  Insight:  Lacking  Psychomotor Activity:  Psychomotor Retardation  Akathisia:  No  Handed:  Right  AIMS (if indicated):  AIMS: Facial and Oral Movements Muscles of Facial Expression: None, normal Lips and Perioral Area: None, normal Jaw: None, normal Tongue: None, normal,Extremity Movements Upper (arms, wrists, hands, fingers): None, normal Lower (legs, knees, ankles, toes): None, normal, Trunk Movements Neck, shoulders, hips: None, normal, Overall Severity Severity of abnormal movements (highest score from questions above): None, normal Incapacitation due to abnormal movements: None, normal Patient's  awareness of abnormal movements (rate only patient's report): No Awareness, Dental Status Current problems with teeth and/or dentures?: No Does patient usually wear dentures?: No  Assets:  Leisure Time Physical Health Resilience Social Support Talents/Skills    Laboratory/X-Ray Psychological Evaluation(s)   NA  Dr. Marius DitchKumar/Mallorie Norrod   Assessment:  Axis I: Generalized Anxiety Disorder, Major Depression, Recurrent severe and Panic Disorder  AXIS I Generalized Anxiety Disorder, Major Depression, Recurrent severe and Panic Disorder  AXIS II Deferred  AXIS III Past Medical History  Diagnosis Date  . Depression   . Pleurisy 2011  . Obesity   . Allergy   . Anxiety   . Glaucoma     2 acute episodes     AXIS IV economic problems, educational problems, occupational problems, other psychosocial or environmental problems, problems related to legal system/crime, problems related to social environment, problems with access  to health care services and problems with primary support group  AXIS V 41-50 serious symptoms   Treatment Plan/Recommendations: Pt is a 33 year old Caucasian male, referred to IOP by Dr. Leretha Dykes for depression. Depression has been around, since age 42.  Pt is on clonazepam at bedtime and trazodone 50 mg hs, given to pt in ED for crisis mode/sleep. Pt says he's not taking either medication consistently.  Pt was having panic attacks, listless, no motivation x 4 months. Current stressors are being fired from job. Pt having panic attacks because of his job, also having vertigo, but it dissipated, after 2 months. Pt was on bupropion for depression, but went manic. Sleeping 4-6 hours, appetite fluctuates. Mood is depressed, anxious, with panic attacks. Depression 6/10, Anxiety, 2/10. He denies Si/hi/avh. Pt is depressed; flat affect. Pt is no longer having the panic attacks, since his job loss; it's more the depression. Will trial citalopram 10 mg po for depression anxiety, because  of the financial constraints. Pt will come back on Tuesday for IOP, and group therapy, and then start seeing a psychiatrist, Dr. Sharl Ma for medication management.   Plan of Care: IOP  Laboratory:  NA  Psychotherapy: yes   Medications: Citalopram 10 mg po depression/anxiety  Routine PRN Medications:  Yes  Consultations:as needed   Safety Concerns:  no  Other:      [facility] 7/10/20152:07 PM

## 2013-09-29 ENCOUNTER — Other Ambulatory Visit (HOSPITAL_COMMUNITY): Payer: 59 | Admitting: Psychiatry

## 2013-09-29 ENCOUNTER — Telehealth (HOSPITAL_COMMUNITY): Payer: Self-pay | Admitting: Psychiatry

## 2013-09-30 ENCOUNTER — Other Ambulatory Visit (HOSPITAL_COMMUNITY): Payer: 59 | Admitting: Psychiatry

## 2013-10-01 ENCOUNTER — Other Ambulatory Visit (HOSPITAL_COMMUNITY): Payer: 59 | Admitting: Psychiatry

## 2013-10-01 ENCOUNTER — Encounter (HOSPITAL_COMMUNITY): Payer: Self-pay | Admitting: Psychiatry

## 2013-10-01 DIAGNOSIS — F332 Major depressive disorder, recurrent severe without psychotic features: Secondary | ICD-10-CM | POA: Diagnosis not present

## 2013-10-01 NOTE — Progress Notes (Signed)
    Daily Group Progress Note  Program: IOP  Group Time: 9:00-10:30  Participation Level: Active  Behavioral Response: Appropriate  Type of Therapy:  Psycho-education Group  Summary of Progress: Pt. Participated in grief and loss session facilitated by Theda BelfastBob Hamilton.     Group Time: 10:30-12:00  Participation Level:  Active  Behavioral Response: Appropriate  Type of Therapy: Group Therapy  Summary of Progress: Pt. Discussed weight loss history of 100 pounds. Pt. Discussed relationship history, pattern of emotional disconnection, desire for friendships. Pt. Continues to discuss loss of employment as significant stressor.  Karl Bell, Karl Bell, COUNS

## 2013-10-02 ENCOUNTER — Other Ambulatory Visit (HOSPITAL_COMMUNITY): Payer: 59 | Admitting: Psychiatry

## 2013-10-02 VITALS — BP 120/80 | HR 84 | Ht 71.0 in | Wt 252.0 lb

## 2013-10-03 ENCOUNTER — Other Ambulatory Visit (HOSPITAL_COMMUNITY): Payer: 59 | Admitting: Psychiatry

## 2013-10-03 DIAGNOSIS — F332 Major depressive disorder, recurrent severe without psychotic features: Secondary | ICD-10-CM

## 2013-10-03 MED ORDER — CLONAZEPAM 1 MG PO TABS: 1.0000 mg | ORAL_TABLET | Freq: Every day | ORAL | Status: DC

## 2013-10-03 NOTE — Progress Notes (Signed)
    Daily Group Progress Note  Program: IOP  Group Time: 0900-1030  Participation Level: Active  Behavioral Response: Appropriate, Sharing, Grandiose and Monopolizing  Type of Therapy:  Process Group  Summary of Progress: Patient discussed the loss of his job and maintaining daily structure at this time.  Reports how difficult it is for him to get a good night sleep.  Pt realizes how important effective sleep hygiene techniques are important.     Group Time: 1045-1200  Participation Level:  Active  Behavioral Response: Appropriate  Type of Therapy: Psycho-education Group  Summary of Progress: Went for a walk at a nearby park.  Learned breath work, guided imagery and progressive relaxation techniques. Practiced ways to breathe, tense and relax muscles through guiding the mind and body connection.

## 2013-10-03 NOTE — Progress Notes (Signed)
Patient ID: Karl Bell, male   DOB: 05/22/1980, 33 y.o.   MRN: [id number] Pt seen states that he never started the Celex[facility].  He saw Dr Evelene CroonKaur who started him on a starter pack of Vibryd . States tol it well. Pt has a hx of 2 episodes of increased intraocular pressure of unknown origin.   Discussed dc Trazadone due to anticholinergic side effects , pt comfortable with this. Will take Klonopin 1 mg po q hs and continue Vibryd.   Denies no SI /HI. No hallucinations/ delusions.

## 2013-10-06 ENCOUNTER — Encounter (HOSPITAL_COMMUNITY): Payer: Self-pay | Admitting: Psychiatry

## 2013-10-06 ENCOUNTER — Other Ambulatory Visit (HOSPITAL_COMMUNITY): Payer: 59 | Admitting: Psychiatry

## 2013-10-06 DIAGNOSIS — F411 Generalized anxiety disorder: Secondary | ICD-10-CM

## 2013-10-06 DIAGNOSIS — F332 Major depressive disorder, recurrent severe without psychotic features: Secondary | ICD-10-CM

## 2013-10-06 NOTE — Progress Notes (Signed)
    Daily Group Progress Note  Program: IOP  Group Time: 9:00-10:30  Participation Level: Active  Behavioral Response: Appropriate  Type of Therapy:  Group Therapy  Summary of Progress: Pt. Reports that his mood is "ok". Pt. Reports that he continues to be challenged by sleep and had a rough time sleeping last night. Pt. Reports that he slept 5 hours last night but he woke up feeling tired. Pt. Reports that he is doing well with his mood and continuing to work on socialization.     Group Time: 10:30-12:00  Participation Level:  Active  Behavioral Response: Appropriate  Type of Therapy: Psycho-education Group  Summary of Progress: Pt. Was alert and attentive during grief and loss facilitated by Theda BelfastBob Hamilton.  [name], Leea Rambeau B, COUNS

## 2013-10-07 ENCOUNTER — Other Ambulatory Visit (HOSPITAL_COMMUNITY): Payer: 59 | Admitting: Psychiatry

## 2013-10-07 ENCOUNTER — Encounter (HOSPITAL_COMMUNITY): Payer: Self-pay | Admitting: Psychiatry

## 2013-10-07 DIAGNOSIS — F332 Major depressive disorder, recurrent severe without psychotic features: Secondary | ICD-10-CM

## 2013-10-07 DIAGNOSIS — F411 Generalized anxiety disorder: Secondary | ICD-10-CM

## 2013-10-07 NOTE — Progress Notes (Signed)
    Daily Group Progress Note  Program: IOP  Group Time: 9:00-10:30  Participation Level: Active  Behavioral Response: Appropriate  Type of Therapy:  Group Therapy  Summary of Progress: Pt. Reports that his mood has improved. Pt. Reports that he slept better last night and was able to forego his afternoon nap by keeping busy throughout the afternoon. Pt. Discussed emotional relationship to food.     Group Time: 10:30-12:00  Participation Level:  Active  Behavioral Response: Appropriate  Type of Therapy: Psycho-education Group  Summary of Progress: Discussed developing shame resilience; watched [employer] Lois O[employer] video.  [name], Artia Singley B, COUNS

## 2013-10-08 ENCOUNTER — Other Ambulatory Visit (HOSPITAL_COMMUNITY): Payer: 59 | Admitting: Psychiatry

## 2013-10-08 ENCOUNTER — Encounter (HOSPITAL_COMMUNITY): Payer: Self-pay | Admitting: Psychiatry

## 2013-10-08 DIAGNOSIS — F332 Major depressive disorder, recurrent severe without psychotic features: Secondary | ICD-10-CM | POA: Diagnosis not present

## 2013-10-08 NOTE — Progress Notes (Signed)
    Daily Group Progress Note  Program: IOP  Group Time: 9:00-10:30  Participation Level: Active  Behavioral Response: Appropriate  Type of Therapy:  Group Therapy  Summary of Progress: Pt. Shared experience of developing vulnerability and communication skills related to dating relationships. Pt. Reported that he was feeling mildly anxious this morning, but found talking in the group calming.     Group Time: 10:30-12:00  Participation Level:  Active  Behavioral Response: Appropriate  Type of Therapy: Psycho-education Group  Summary of Progress: Continued with discussion about the development of shame resilience and vulnerability.  Karl Bell, Karl Bell, COUNS

## 2013-10-09 ENCOUNTER — Encounter (HOSPITAL_COMMUNITY): Payer: Self-pay | Admitting: Psychiatry

## 2013-10-09 ENCOUNTER — Other Ambulatory Visit (HOSPITAL_COMMUNITY): Payer: 59 | Admitting: Psychiatry

## 2013-10-09 DIAGNOSIS — F332 Major depressive disorder, recurrent severe without psychotic features: Secondary | ICD-10-CM | POA: Diagnosis not present

## 2013-10-09 NOTE — Progress Notes (Signed)
    Daily Group Progress Note  Program: IOP  Group Time: 9:00-10:30  Participation Level: Active  Behavioral Response: Appropriate  Type of Therapy:  Group Therapy  Summary of Progress: Pt. Reported that he slept 8 hours last night because he was really tired and was able to avoid taking a nap. Pt. Reports that he continues to be challenged by low motivation and low energy. Pt. Reports that he feels hopeful about getting better and is planning to have lunch with his parents today for support. Pt. Identified values of people, creativity, problem solving, and personal autonomy.     Group Time: 10:30-12:00  Participation Level:  Active  Behavioral Response: Appropriate  Type of Therapy: Psycho-education Group  Summary of Progress: Pt. Participated in discussion about developing self-acceptance.  [name], Jennifer B, COUNS

## 2013-10-10 ENCOUNTER — Other Ambulatory Visit (HOSPITAL_COMMUNITY): Payer: 59 | Admitting: Psychiatry

## 2013-10-10 ENCOUNTER — Encounter (HOSPITAL_COMMUNITY): Payer: Self-pay | Admitting: Psychiatry

## 2013-10-10 DIAGNOSIS — F332 Major depressive disorder, recurrent severe without psychotic features: Secondary | ICD-10-CM

## 2013-10-10 DIAGNOSIS — F411 Generalized anxiety disorder: Secondary | ICD-10-CM

## 2013-10-10 NOTE — Progress Notes (Signed)
Karl Bell is a 33 y.o. single, Caucasian, male who was referred per psychologist ([name]), treatment for panic attacks and depressive symptoms. Patient stated that he had been having anxiety which had been causing frequent panic attacks. Patient reported that he had panic attacks daily, usually in the mornings when he would get ready for work. Patient stated the panic attacks keep him from making it to work. Patient stated that he had not been to work since 09-08-13 due to the anxiety. Patient described associated symptoms as anxiousness, nausea, heart racing and dry mouth. Patient reported his anxiety and depression began last year when his supervisor left the company. 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 Patient stated work [employer]or[employer] of six years was the primary source of his stress. Stated he was fired on 09-12-13. Patient stated he had been experiencing symptoms for the past 2 months and that things became worse a couple of weeks ago. Patient rated his anxiety 7/10. Stated his sleep was poor, averaging 2-4 hours per night and "not feeling rested when I wake up." Patient reports that he uses marijuana "to cope with the anxiety." He states he has gained 25 lbs in the past 6 months and that he is very aware of the weight gain; states he weighed 185 lbs 1.5 years ago. States has been making poor food choices and eating out and eating fast food instead of cooking at home like he used to do. Other symptoms included: Poor self esteem, no motivation, decreased concentration, anhedonia, low energy, crying spells, helplessness, hopelessness, and worthlessness feelings. Patient stated "I have suicidal ideation daily but I don't have a plan and I am able to put those thoughts away. I wouldn't act on the thoughts because I don't want to hurt my family especially my father; his brother hung himself on Christmas when I was 2." Discussed safety options with pt at  length. Pt was able to contract for safety. Patient denies HI and A/V hallucinations. No previous psychiatric hospitalizations. Denies any suicide attempts. Admits to hx of self injurious behavior (taking his fists and hitting trees). Pt has been seeing Dr. Bascom LevelsFrazier for 1 1/2 yrs.  Family Hx: Paternal Uncle (committed suicide by way of hanging), maternal GF (ETOH), paternal GM (dementia)  Pt completed MH-IOP today.  States he continues to struggle with poor sleep and concentration.  Reports binge eating.  Denies SI/HI or A/V hallucinations.  Reports that the groups were helpful with providing him with structure and support.  [facility]; especially [facility].  A:  D/C today.  Will F/U with Dr. Evelene CroonKaur on 11-10-13 @ 8:15 a.m.  Pt will call Dr. Bascom LevelsFrazier for f/u appt.  Encouraged support groups.  Also, encouraged pt to complete the financial assistance form as soon as possible.  R:  Pt receptive.

## 2013-10-10 NOTE — Progress Notes (Signed)
Discharge Note  Patient:  Karl Bell is an 33 y.o., male DOB:  1980-09-29  Date of Admission:  09/24/13  Date of Discharge:  10/10/13  Reason for Admission  Karl Quandam Passon is a 33 y.o. single, Caucasian, male who was referred per psychologist ([name]), treatment for panic attacks and depressive symptoms. Patient states that he has been having anxiety which has been causing frequent panic attacks. Patient reports that he has panic attacks daily, usually in the mornings when he would get ready for work. Patient states the panic attacks keep him from making it to work. "I feel like I am falling over the edge. The anxiety causes me to have an inability to take action." Patient stated that he had not been to work since 09-08-13 due to the anxiety. Patient describes associated symptoms as anxiousness, nausea, heart racing and dry mouth. Patient reports his anxiety and depression began last year when his supervisor left the company. 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 States he was fired on 09-12-13. Patient states he has been experiencing symptoms for the past 2 months and that things became worse this week. Patient rates his anxiety 7/10. States his sleep is poor, averaging 2-4 hours per night and "not feeling rested when I wake up." Patient reports that he uses marijuana "to cope with the anxiety." He states he has gained 25 lbs in the past 6 months and that he is very aware of the weight gain; states he weighed 185 lbs 1.5 years ago. States has been making poor food choices and eating out and eating fast food instead of cooking at home like he used to do. The patient states "I have been dealing with depression since I was 33 years old." Other symptoms include: Poor self esteem, no  motivation, decreased concentration, anhedonia, low energy, crying spells, helplessness, hopelessness, and worthlessness feelings. Patient states "I have suicidal ideation daily but I don't have a plan and I am able to put those thoughts away. I wouldn't act on the thoughts because I don't want to hurt my family especially my father; his brother hung himself on Christmas when I was 2." Discussed safety options with pt at length. Pt is able to contract for safety. Patient denies HI and A/V hallucinations. No previous psychiatric hospitalizations. Denies any suicide attempts. Admits to hx of self injurious behavior (taking his fists and hitting trees). Pt has been seeing Dr. Bascom LevelsFrazier for 1 1/2 yrs.  Family Hx: Paternal Uncle (committed suicide by way of hanging), maternal GF (ETOH), paternal GM (dementia)  Childhood: Born in [place], [place]; raised in [place]. Moved to [place], [place] age 33. States parents were "good" parents. Describes himself as being "listless." "I read a lot, usually three to four novels within one week." States he attended a [employer] until he went away to a boarding school in the 10 th grade. States elementary, middle, and high school was a "breeze" for him. "Not to be bragging or anything, but I was among the top 98% of my class. Everything came easy for me. I wasn't challenged at all, until college." According to pt, college was very difficult for him. 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 "I didn't have the social skills at  all." Trauma: Age 52 pt's paternal uncle hung himself. Denies any abuse.  Sibling: Younger brother who resides in [place]. Is an ICU nurse.  Pt resides with a male roommate. Involved with a divorced male who is currently out of the country adopting a baby. "I wouldn't say that we are dating, more so just having sex."  Drugs/ETOH: Denies ETOH. Admits to smoking THC daily. States he usually smokes a bowl in the morning and a bowl in the  evening. Been smoking since age 37. Most recent use was lastnight. Denies current cigarette use. Denies any DWI's. Admitted to trying mushrooms and salva yrs ago.       Hospital Course: Patient started IOP then went and saw his outpatient provider Dr. Evelene Croon who discontinued the Celexa and started him on Vibryd 20 mg every day along with   Klonopin 1 mg by mouth each bedtime. No medication changes were made in the IOP. Patient did well giving and receiving feedback attended all groups and programming. His sleep and appetite were good mood had improved and he was coping well and tolerating his medications well.  Mental Status at Discharge: Alert, oriented x3, affect was appropriate mood was euthymic speech and language was normal, no suicidal or homicidal ideation was noted no hallucinations or delusions was present. Musculoskeletal was normal Recent and remote memory was good judgment and insight is good, concentration and recall are good.  Lab Results: No results found for this or any previous visit (from the past 48 hour(s)).  Current outpatient prescriptions:clonazePAM (KLONOPIN) 1 MG tablet, Take 1 tablet (1 mg total) by mouth at bedtime., Disp: 30 tablet, Rfl: 0  Vibryd 20 mg qhs an Klonopin 1 mg po q hs  Axis Diagnosis:   Axis I: Anxiety Disorder NOS and Major Depression, Recurrent severe Axis II: Cluster B Traits Axis III:  Past Medical History  Diagnosis Date  . Depression   . Pleurisy 2011  . Obesity   . Allergy   . Anxiety   . Glaucoma     2 acute episodes   Axis IV: economic problems, occupational problems, problems related to social environment and problems with primary support group Axis V: 61-70 mild symptoms   Level of Care:  OP  Discharge destination:  Home  Is patient on multiple antipsychotic therapies at discharge:  No    Has Patient had three or more failed trials of antipsychotic monotherapy by history:  No  Patient phone:  [phone] (home)  Patient  address:   [address] [place] [address],   Follow-up recommendations:  Activity:  As tolerated Diet:   regular Other:  Followup for medications and therapy as scheduled with Dr.Kaur    The patient received suicide prevention pamphlet:  No   Margit Banda 10/10/2013, 12:54 PM

## 2013-10-10 NOTE — Patient Instructions (Signed)
Patient completed MH-IOP today.  Follow up with Dr. Evelene CroonKaur on  11-10-13 at 8:15 a.m., pt will call Dr. Bascom LevelsFrazier for an appointment.  Encouraged support groups.

## 2013-10-10 NOTE — Progress Notes (Signed)
    Daily Group Progress Note  Program: IOP  Group Time: 9:00-10:30  Participation Level: Active  Behavioral Response: Appropriate  Type of Therapy:  Group Therapy  Summary of Progress: Pt. Shared that he was feeling better, had energy to begin working on organizing his home and wood working project. Pt. Has supportive family and was able to spend time with them. Pt. Continues to work on developing self-compassion, limiting critical thoughts about himself.     Group Time: 10:30-12:00  Participation Level:  Active  Behavioral Response: Appropriate  Type of Therapy: Psycho-education Group  Summary of Progress: Pt. Participated in discussion about developing self-compassion; watched Clovia CuffKristin Neff video.  [name], Jennifer B, COUNS

## 2013-10-13 ENCOUNTER — Other Ambulatory Visit (HOSPITAL_COMMUNITY): Payer: 59

## 2013-10-14 ENCOUNTER — Other Ambulatory Visit (HOSPITAL_COMMUNITY): Payer: 59

## 2013-10-15 ENCOUNTER — Other Ambulatory Visit (HOSPITAL_COMMUNITY): Payer: 59

## 2014-07-01 IMAGING — CR DG WRIST COMPLETE 3+V*L*
2 series · 2 of 2 positions shown · non-contrast
Comparison: None.

CLINICAL DATA: Wrist mass.None.

LEFT WRIST - COMPLETE 3+ VIEW

[PA]
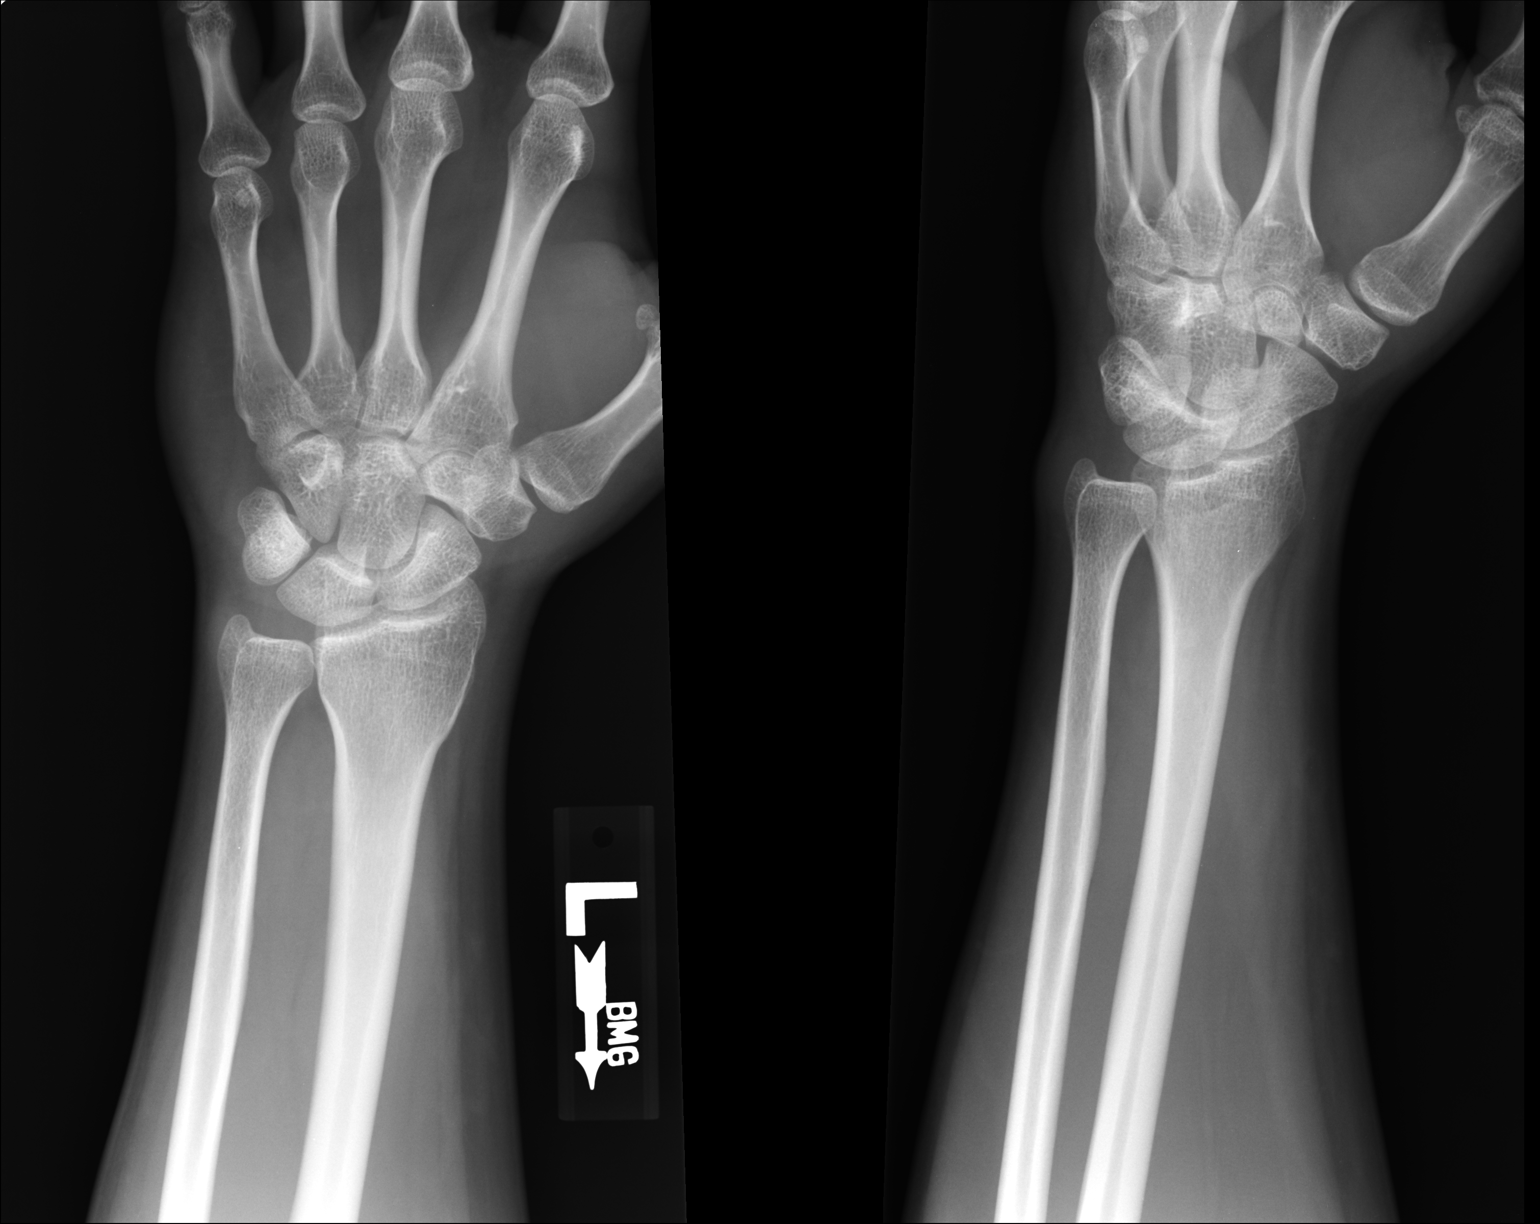

[lateral]
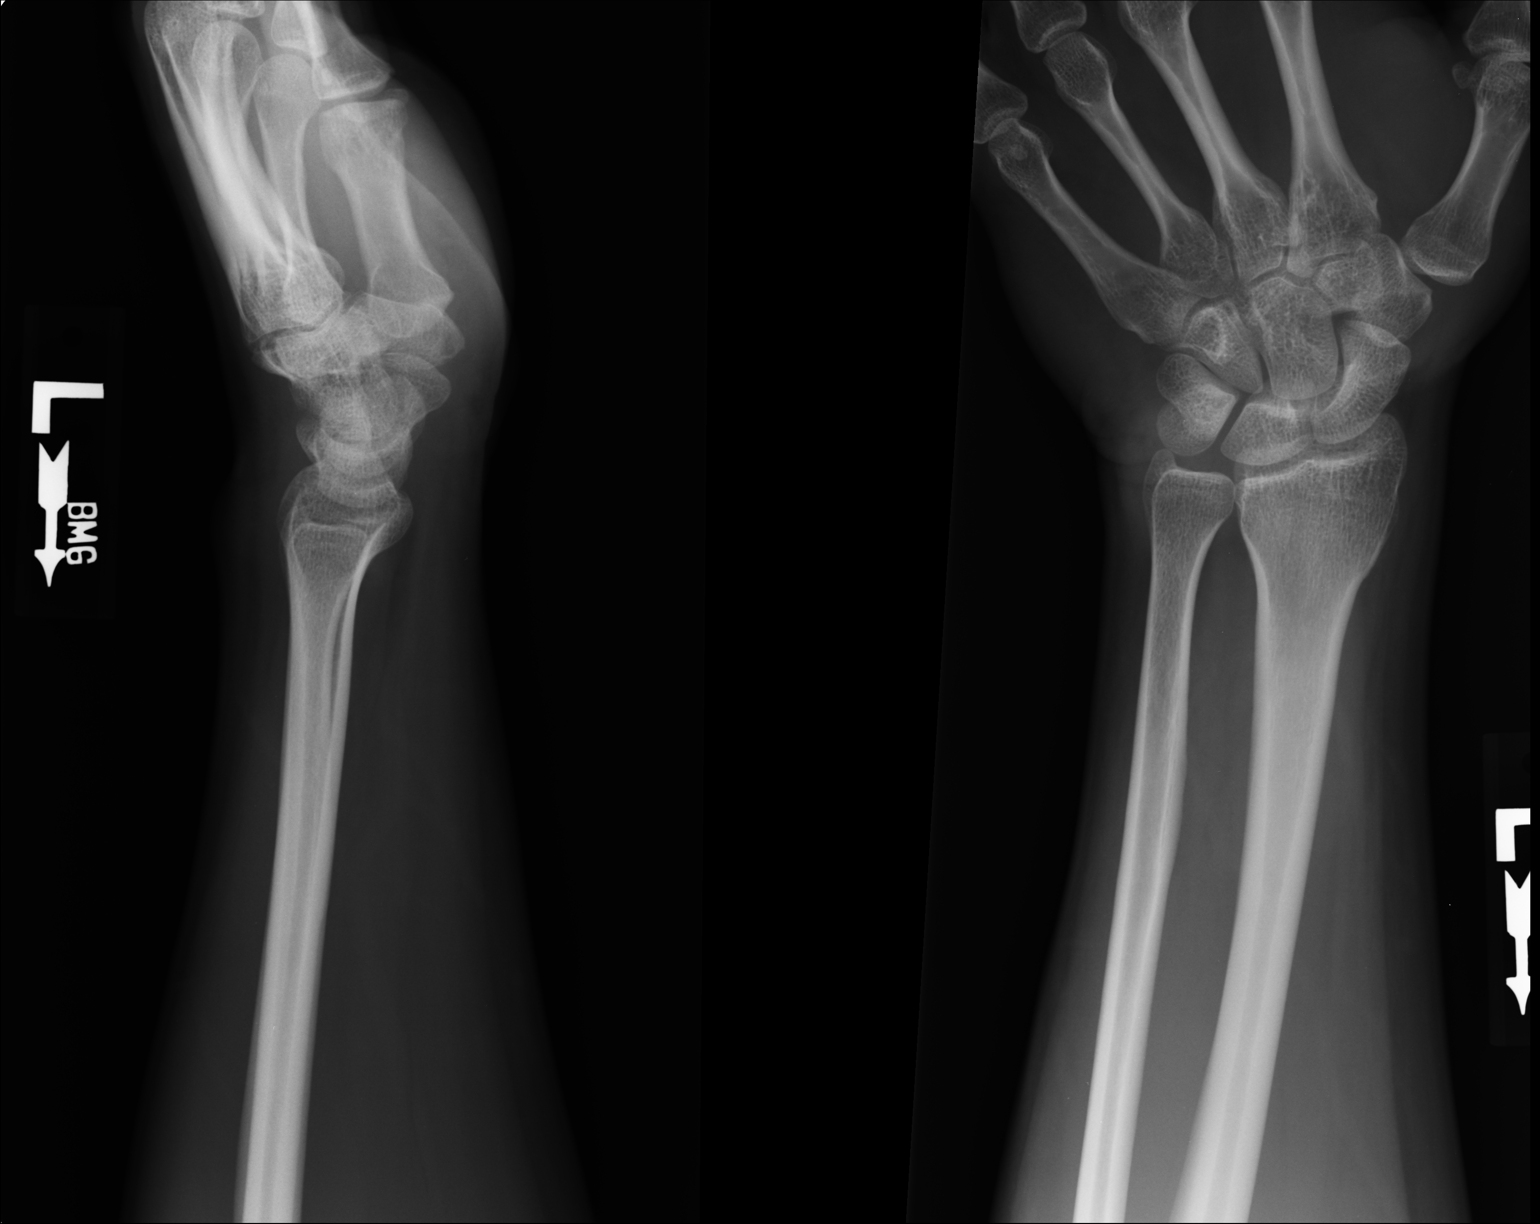

[2 of 2 positions shown; findings below may reference images not displayed]

FINDINGS: No acute bony abnormality.  Specifically, no fracture,
subluxation, or dislocation.  Soft tissues are intact.  No
radiopaque foreign body.
IMPRESSION: No acute bony abnormality.

Clinically significant discrepancy from primary report, if
provided: None

## 2015-04-05 ENCOUNTER — Ambulatory Visit (INDEPENDENT_AMBULATORY_CARE_PROVIDER_SITE_OTHER): Payer: 59 | Admitting: Physician Assistant

## 2015-04-05 VITALS — BP 128/80 | HR 75 | Temp 97.0°F | Resp 16 | Ht 70.0 in | Wt 220.0 lb

## 2015-04-05 DIAGNOSIS — H6983 Other specified disorders of Eustachian tube, bilateral: Secondary | ICD-10-CM

## 2015-04-05 DIAGNOSIS — J069 Acute upper respiratory infection, unspecified: Secondary | ICD-10-CM

## 2015-04-05 MED ORDER — IPRATROPIUM BROMIDE 0.03 % NA SOLN: 2.0000 | Freq: Two times a day (BID) | NASAL | Status: AC

## 2015-04-05 MED ORDER — PSEUDOEPHEDRINE HCL ER 120 MG PO TB12: 120.0000 mg | ORAL_TABLET | Freq: Every day | ORAL | Status: AC

## 2015-04-05 NOTE — Patient Instructions (Signed)
Take sudafed once a day in the mornings for 1-2 weeks. Use nasal spray twice a day for 2 weeks. Continue zyrtec Continue salt water gargles. Drink plenty of water (64 oz/day) and get plenty of rest. If your symptoms are not improving in 2 weeks or if symptoms worsen at any time, return to clinic.

## 2015-04-05 NOTE — Progress Notes (Signed)
  Medical screening examination/treatment/procedure(s) were performed by non-physician practitioner and as supervising physician I was immediately available for consultation/collaboration.     

## 2015-04-05 NOTE — Progress Notes (Signed)
[facility] [address], [place] [address] [phone]  Date:  04/05/2015   Name:  Karl Bell   DOB:  09/30/1980   MRN:  [id number]  PCP:  No PCP Per Patient    Chief Complaint: Nasal Congestion and Ear Pain   History of Present Illness:  This is a 35 y.o. male with PMH depression and glaucoma who is presenting with postnasal drip x 5 days and bilateral otalgia x 3 days. Feels like there is fluid behind his ears. Getting occ sharp pain. Pt had 5 myringotomies prior to age 33. He states he is very careful when it comes to his ears. It has been several years since he had an ear infection.  Cough: occ SOB/wheezing: no Nasal congestion: not much congestion but is having postnasal drip. Otalgia: yes Sore throat: mild, just started today Fever/chills: no Aggravating/alleviating factors: salt water gargles and helps with postnasal drip. History of asthma: no History of env allergies: yes, takes zyrtec every day. Tobacco use: no Overall feels ok, just worried about his ears.   Review of Systems:  Review of Systems See HPI  Patient Active Problem List   Diagnosis Date Noted  . Depression 06/28/2012  . Glaucoma 06/28/2012  . Obesity     Prior to Admission medications   Not on File    Allergies  Allergen Reactions  . Sulfa Antibiotics Anaphylaxis    severe  . Penicillins Hives    severe    Past Surgical History  Procedure Laterality Date  . Myringotomy with tube placement Bilateral     x 5 sets of tubes  . Tonsillectomy and adenoidectomy      Social History  Substance Use Topics  . Smoking status: Never Smoker   . Smokeless tobacco: Never Used  . Alcohol Use: 1.0 - 1.5 oz/week    2-3 drink(s) per week    Family History  Problem Relation Age of Onset  . Diabetes Mother   . Celiac disease Maternal Grandmother   . Heart disease Maternal Grandfather   . Alcohol abuse Maternal Grandfather   . Mental illness Paternal Grandmother    . Dementia Paternal Grandmother   . Heart disease Paternal Grandfather     s/p 4V CABG  . Depression Paternal Uncle     Medication list has been reviewed and updated.  Physical Examination:  Physical Exam  Constitutional: He is oriented to person, place, and time. He appears well-developed and well-nourished. No distress.  HENT:  Head: Normocephalic and atraumatic.  Right Ear: Hearing, external ear and ear canal normal. Tympanic membrane is retracted.  Left Ear: Hearing, external ear and ear canal normal. Tympanic membrane is retracted.  Nose: Nose normal. Right sinus exhibits no maxillary sinus tenderness and no frontal sinus tenderness. Left sinus exhibits no maxillary sinus tenderness and no frontal sinus tenderness.  Mouth/Throat: Uvula is midline and mucous membranes are normal. Posterior oropharyngeal erythema present. No oropharyngeal exudate or posterior oropharyngeal edema.  Scarring bilateral TMs  Eyes: Conjunctivae and lids are normal. Right eye exhibits no discharge. Left eye exhibits no discharge. No scleral icterus.  Cardiovascular: Normal rate, regular rhythm, normal heart sounds and normal pulses.   No murmur heard. Pulmonary/Chest: Effort normal and breath sounds normal. No respiratory distress. He has no wheezes. He has no rhonchi. He has no rales.  Musculoskeletal: Normal range of motion.  Lymphadenopathy:       Head (right side): No submental, no submandibular and no tonsillar adenopathy  present.       Head (left side): No submental, no submandibular and no tonsillar adenopathy present.    He has no cervical adenopathy.  Neurological: He is alert and oriented to person, place, and time.  Skin: Skin is warm, dry and intact. No lesion and no rash noted.  Psychiatric: He has a normal mood and affect. His speech is normal and behavior is normal. Thought content normal.   BP 128/80 mmHg  Pulse 75  Temp(Src) 97 F (36.1 C) (Oral)  Resp 16  Ht 5\' 10"  (1.778 m)  Wt  220 lb (99.791 kg)  BMI 31.57 kg/m2  SpO2 97%  Assessment and Plan:  1. ETD (eustachian tube dysfunction), bilateral 2. Viral URI Suspect ETD due to viral URI. Treat with sudafed QAM x 1-2 weeks and atrovent nasal spray bid x 2 weeks. Return if symptoms not improving in 2 weeks or sooner if anything changes. - pseudoephedrine (SUDAFED 12 HOUR) 120 MG 12 hr tablet; Take 1 tablet (120 mg total) by mouth daily.  Dispense: 30 tablet; Refill: 0 - ipratropium (ATROVENT) 0.03 % nasal spray; Place 2 sprays into both nostrils 2 (two) times daily.  Dispense: 30 mL; Refill: 0    Nicole V. Dyke BrackettBush, PA-C, MHS [facility]  04/05/2015

## 2015-09-06 ENCOUNTER — Ambulatory Visit (INDEPENDENT_AMBULATORY_CARE_PROVIDER_SITE_OTHER): Payer: 59 | Admitting: Family Medicine

## 2015-09-06 VITALS — BP 116/72 | HR 60 | Temp 97.5°F | Resp 18 | Ht 70.0 in | Wt 230.0 lb

## 2015-09-06 DIAGNOSIS — L03211 Cellulitis of face: Secondary | ICD-10-CM

## 2015-09-06 DIAGNOSIS — S00262A Insect bite (nonvenomous) of left eyelid and periocular area, initial encounter: Secondary | ICD-10-CM

## 2015-09-06 DIAGNOSIS — W57XXXA Bitten or stung by nonvenomous insect and other nonvenomous arthropods, initial encounter: Secondary | ICD-10-CM | POA: Diagnosis not present

## 2015-09-06 MED ORDER — DOXYCYCLINE HYCLATE 100 MG PO TABS: 100.0000 mg | ORAL_TABLET | Freq: Two times a day (BID) | ORAL | Status: AC

## 2015-09-06 NOTE — Progress Notes (Signed)
Patient ID: Karl Bell, male    DOB: 1981-01-31  Age: 35 y.o. MRN: [id number]  Chief Complaint  Patient presents with  . 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 He works long shifts on the weekend Friday Saturday Sunday. He noticed on Friday morning that his left eye had some swelling of the lower lid/orbit area. He has taken a series of pictures of his eye. He does not know of being bit or stung by anything specific. It has not been itching a lot, but has some mild tenderness to the tissues. He has gotten increasingly puffy on the lower lid and slightly redder. The eye itself does not seem to be involved and vision is not been bad. He has noticed a little induration and discomfort across the cheek bone area down to under the left side of the jaw where he has a little tender node. His nurse brother has been talking with him about it. It was not doing better so he came on in today.   He takes Zyrtec for allergies but does not have any other major illnesses or regular medications.  Current allergies, medications, problem list, past/family and social histories reviewed.  Objective:  BP 116/72 mmHg  Pulse 60  Temp(Src) 97.5 F (36.4 C) (Oral)  Resp 18  Ht 5\' 10"  (1.778 m)  Wt 230 lb (104.327 kg)  BMI 33.00 kg/m2  SpO2 96%  No major distress. Left a has puffiness under the eye in the left lower lid down to the upper portion of the cheek. There is mild erythema. No major warmth. Centrally there is a little area of excoriation. Mildly tender node under the angle of the jaw.  Visual acuity good.Always wears glasses.  Assessment & Plan:   Assessment: 1. Cellulitis of face   2. Insect bite       Plan: I suspect this is a bite with low-grade cellulitis infection associated with it. Will treat with oral antibiotics and  continue use of antihistamines.    No orders of the defined types were placed in this encounter.    Meds ordered this encounter  Medications  . cetirizine (ZYRTEC) 10 MG tablet    Sig: Take 10 mg by mouth daily.  Marland Kitchen. doxycycline (VIBRA-TABS) 100 MG tablet    Sig: Take 1 tablet (100 mg total) by mouth 2 (two) times daily.    Dispense:  14 tablet    Refill:  0         Patient Instructions   Take doxycycline 100 mg one twice daily. Take caution about risk of sunburn, and avoid taking the medicine with dairy products.  Continue taking the Zyrtec (cetirizine) for a couple of days you can take it twice daily  If the face is getting more swollen or painful or hot or any visual concerns come back in promptly for a recheck.  Return as necessary    IF you received an x-ray today, you will receive an invoice from [facility]. Please contact [facility] at [phone] with questions or concerns regarding your invoice.   IF you received labwork today, you will receive an invoice from [facility]. Please contact [facility] at [phone] with questions or concerns regarding your invoice.   Our billing staff will not be able to assist  you with questions regarding bills from these companies.  You will be contacted with the lab results as soon as they are available. The fastest way to get your results is to activate your My Chart account. Instructions are located on the last page of this paperwork. If you have not heard from [place] regarding the results in 2 weeks, please contact this office.         Return if symptoms worsen or fail to improve.   Shelia Kingsberry, MD 09/06/2015

## 2015-09-06 NOTE — Patient Instructions (Addendum)
Take doxycycline 100 mg one twice daily. Take caution about risk of sunburn, and avoid taking the medicine with dairy products.  Continue taking the Zyrtec (cetirizine) for a couple of days you can take it twice daily  If the face is getting more swollen or painful or hot or any visual concerns come back in promptly for a recheck.  Return as necessary    IF you received an x-ray today, you will receive an invoice from [facility]. Please contact [facility] at [phone] with questions or concerns regarding your invoice.   IF you received labwork today, you will receive an invoice from [facility]. Please contact [facility] at [phone] with questions or concerns regarding your invoice.   Our billing staff will not be able to assist you with questions regarding bills from these companies.  You will be contacted with the lab results as soon as they are available. The fastest way to get your results is to activate your My Chart account. Instructions are located on the last page of this paperwork. If you have not heard from us regarding the results in 2 weeks, please contact this office.

## 2016-07-17 DIAGNOSIS — H16142 Punctate keratitis, left eye: Secondary | ICD-10-CM | POA: Diagnosis not present

## 2016-07-17 DIAGNOSIS — H578 Other specified disorders of eye and adnexa: Secondary | ICD-10-CM | POA: Diagnosis not present
